# Patient Record
Sex: Female | Born: 1950 | Race: White | Hispanic: No | Marital: Married | Smoking: Never smoker
Health system: Southern US, Community
[De-identification: ages and names within clinical notes are randomized; demographics above are authoritative.]

## PROBLEM LIST (undated history)

## (undated) DIAGNOSIS — A5002 Early congenital syphilitic osteochondropathy: Secondary | ICD-10-CM

## (undated) DIAGNOSIS — E039 Hypothyroidism, unspecified: Secondary | ICD-10-CM

## (undated) DIAGNOSIS — B029 Zoster without complications: Secondary | ICD-10-CM

## (undated) DIAGNOSIS — M0520 Rheumatoid vasculitis with rheumatoid arthritis of unspecified site: Secondary | ICD-10-CM

## (undated) DIAGNOSIS — M9080 Osteopathy in diseases classified elsewhere, unspecified site: Secondary | ICD-10-CM

## (undated) HISTORY — PX: ABDOMINAL SURGERY: SHX537

---

## 1999-05-25 ENCOUNTER — Other Ambulatory Visit: Admission: RE | Admit: 1999-05-25 | Discharge: 1999-05-25 | Payer: Self-pay | Admitting: Obstetrics & Gynecology

## 2000-06-19 ENCOUNTER — Other Ambulatory Visit: Admission: RE | Admit: 2000-06-19 | Discharge: 2000-06-19 | Payer: Self-pay | Admitting: Obstetrics & Gynecology

## 2001-02-14 ENCOUNTER — Other Ambulatory Visit: Admission: RE | Admit: 2001-02-14 | Discharge: 2001-02-14 | Payer: Self-pay | Admitting: Obstetrics & Gynecology

## 2001-10-22 ENCOUNTER — Other Ambulatory Visit: Admission: RE | Admit: 2001-10-22 | Discharge: 2001-10-22 | Payer: Self-pay | Admitting: Obstetrics & Gynecology

## 2002-01-06 ENCOUNTER — Encounter: Admission: RE | Admit: 2002-01-06 | Discharge: 2002-01-06 | Payer: Self-pay | Admitting: Obstetrics & Gynecology

## 2002-01-06 ENCOUNTER — Encounter: Payer: Self-pay | Admitting: Obstetrics & Gynecology

## 2002-11-27 ENCOUNTER — Encounter: Admission: RE | Admit: 2002-11-27 | Discharge: 2002-11-27 | Payer: Self-pay | Admitting: Family Medicine

## 2002-12-08 ENCOUNTER — Other Ambulatory Visit: Admission: RE | Admit: 2002-12-08 | Discharge: 2002-12-08 | Payer: Self-pay | Admitting: Obstetrics & Gynecology

## 2003-01-08 ENCOUNTER — Encounter: Admission: RE | Admit: 2003-01-08 | Discharge: 2003-01-08 | Payer: Self-pay | Admitting: Obstetrics & Gynecology

## 2003-01-20 ENCOUNTER — Encounter: Admission: RE | Admit: 2003-01-20 | Discharge: 2003-01-20 | Payer: Self-pay | Admitting: Obstetrics & Gynecology

## 2003-02-15 ENCOUNTER — Other Ambulatory Visit: Admission: RE | Admit: 2003-02-15 | Discharge: 2003-02-15 | Payer: Self-pay | Admitting: Obstetrics & Gynecology

## 2003-06-17 ENCOUNTER — Ambulatory Visit (HOSPITAL_COMMUNITY): Admission: RE | Admit: 2003-06-17 | Discharge: 2003-06-17 | Payer: Self-pay | Admitting: Obstetrics & Gynecology

## 2003-07-05 ENCOUNTER — Other Ambulatory Visit: Admission: RE | Admit: 2003-07-05 | Discharge: 2003-07-05 | Payer: Self-pay | Admitting: Obstetrics & Gynecology

## 2003-12-17 ENCOUNTER — Ambulatory Visit: Payer: Self-pay | Admitting: Internal Medicine

## 2004-01-17 ENCOUNTER — Ambulatory Visit (HOSPITAL_COMMUNITY): Admission: RE | Admit: 2004-01-17 | Discharge: 2004-01-17 | Payer: Self-pay | Admitting: Obstetrics & Gynecology

## 2004-02-02 ENCOUNTER — Encounter: Admission: RE | Admit: 2004-02-02 | Discharge: 2004-02-02 | Payer: Self-pay | Admitting: Obstetrics & Gynecology

## 2004-07-17 ENCOUNTER — Ambulatory Visit: Payer: Self-pay | Admitting: Internal Medicine

## 2004-08-10 ENCOUNTER — Ambulatory Visit: Payer: Self-pay | Admitting: Pulmonary Disease

## 2004-08-21 ENCOUNTER — Ambulatory Visit: Payer: Self-pay | Admitting: Internal Medicine

## 2004-12-07 ENCOUNTER — Ambulatory Visit (HOSPITAL_COMMUNITY): Admission: RE | Admit: 2004-12-07 | Discharge: 2004-12-07 | Payer: Self-pay | Admitting: Obstetrics & Gynecology

## 2005-01-26 ENCOUNTER — Ambulatory Visit (HOSPITAL_COMMUNITY): Admission: RE | Admit: 2005-01-26 | Discharge: 2005-01-26 | Payer: Self-pay | Admitting: Gastroenterology

## 2010-02-18 ENCOUNTER — Encounter: Payer: Self-pay | Admitting: Obstetrics & Gynecology

## 2010-09-26 ENCOUNTER — Other Ambulatory Visit: Payer: Self-pay | Admitting: Gastroenterology

## 2010-09-26 ENCOUNTER — Ambulatory Visit (HOSPITAL_COMMUNITY)
Admission: RE | Admit: 2010-09-26 | Discharge: 2010-09-26 | Disposition: A | Discharge status: Home / Self Care | Payer: Medicare Other | Source: Ambulatory Visit | Attending: Gastroenterology | Admitting: Gastroenterology

## 2010-09-26 DIAGNOSIS — K644 Residual hemorrhoidal skin tags: Secondary | ICD-10-CM | POA: Insufficient documentation

## 2010-09-26 DIAGNOSIS — Z1211 Encounter for screening for malignant neoplasm of colon: Secondary | ICD-10-CM | POA: Insufficient documentation

## 2010-09-26 DIAGNOSIS — K648 Other hemorrhoids: Secondary | ICD-10-CM | POA: Insufficient documentation

## 2011-01-30 HISTORY — PX: OTHER SURGICAL HISTORY: SHX169

## 2014-11-01 ENCOUNTER — Other Ambulatory Visit: Payer: Self-pay | Admitting: Gastroenterology

## 2014-11-26 ENCOUNTER — Encounter (HOSPITAL_COMMUNITY): Admission: RE | Payer: Self-pay | Source: Ambulatory Visit

## 2014-11-26 ENCOUNTER — Ambulatory Visit (HOSPITAL_COMMUNITY): Admission: RE | Admit: 2014-11-26 | Payer: Medicare Other | Source: Ambulatory Visit | Admitting: Gastroenterology

## 2014-11-26 SURGERY — COLONOSCOPY
Anesthesia: Moderate Sedation

## 2014-12-10 ENCOUNTER — Encounter (HOSPITAL_COMMUNITY): Payer: Self-pay | Admitting: *Deleted

## 2014-12-10 ENCOUNTER — Ambulatory Visit (HOSPITAL_COMMUNITY)
Admission: RE | Admit: 2014-12-10 | Discharge: 2014-12-10 | Disposition: A | Discharge status: Home / Self Care | Payer: Medicare Other | Source: Ambulatory Visit | Attending: Gastroenterology | Admitting: Gastroenterology

## 2014-12-10 ENCOUNTER — Encounter (HOSPITAL_COMMUNITY)
Admission: RE | Disposition: A | Discharge status: Home / Self Care | Payer: Self-pay | Source: Ambulatory Visit | Attending: Gastroenterology

## 2014-12-10 DIAGNOSIS — Z86010 Personal history of colonic polyps: Secondary | ICD-10-CM | POA: Diagnosis not present

## 2014-12-10 DIAGNOSIS — D124 Benign neoplasm of descending colon: Secondary | ICD-10-CM | POA: Diagnosis not present

## 2014-12-10 DIAGNOSIS — Z1211 Encounter for screening for malignant neoplasm of colon: Secondary | ICD-10-CM | POA: Diagnosis not present

## 2014-12-10 DIAGNOSIS — K5730 Diverticulosis of large intestine without perforation or abscess without bleeding: Secondary | ICD-10-CM | POA: Diagnosis not present

## 2014-12-10 DIAGNOSIS — A5002 Early congenital syphilitic osteochondropathy: Secondary | ICD-10-CM | POA: Insufficient documentation

## 2014-12-10 DIAGNOSIS — Z942 Lung transplant status: Secondary | ICD-10-CM | POA: Insufficient documentation

## 2014-12-10 HISTORY — DX: Hypothyroidism, unspecified: E03.9

## 2014-12-10 HISTORY — DX: Rheumatoid vasculitis with rheumatoid arthritis of unspecified site: M05.20

## 2014-12-10 HISTORY — DX: Osteopathy in diseases classified elsewhere, unspecified site: M90.80

## 2014-12-10 HISTORY — DX: Early congenital syphilitic osteochondropathy: A50.02

## 2014-12-10 HISTORY — PX: COLONOSCOPY WITH PROPOFOL: SHX5780

## 2014-12-10 SURGERY — COLONOSCOPY WITH PROPOFOL
Anesthesia: Moderate Sedation

## 2014-12-10 MED ORDER — MIDAZOLAM HCL 10 MG/2ML IJ SOLN
INTRAMUSCULAR | Status: DC | PRN
  Administered 2014-12-10: 2 mg via INTRAVENOUS
  Administered 2014-12-10 (×2): 1 mg via INTRAVENOUS
  Administered 2014-12-10: 2 mg via INTRAVENOUS

## 2014-12-10 MED ORDER — DIPHENHYDRAMINE HCL 50 MG/ML IJ SOLN
INTRAMUSCULAR | Status: AC
  Filled 2014-12-10: qty 1

## 2014-12-10 MED ORDER — FENTANYL CITRATE (PF) 100 MCG/2ML IJ SOLN
INTRAMUSCULAR | Status: DC | PRN
  Administered 2014-12-10: 12.5 ug via INTRAVENOUS
  Administered 2014-12-10: 25 ug via INTRAVENOUS
  Administered 2014-12-10: 12.5 ug via INTRAVENOUS
  Administered 2014-12-10: 25 ug via INTRAVENOUS

## 2014-12-10 MED ORDER — SODIUM CHLORIDE 0.9 % IV SOLN: INTRAVENOUS | Status: DC

## 2014-12-10 MED ORDER — MIDAZOLAM HCL 5 MG/ML IJ SOLN
INTRAMUSCULAR | Status: AC
  Filled 2014-12-10: qty 2

## 2014-12-10 MED ORDER — FENTANYL CITRATE (PF) 100 MCG/2ML IJ SOLN
INTRAMUSCULAR | Status: AC
  Filled 2014-12-10: qty 2

## 2014-12-10 SURGICAL SUPPLY — 21 items

## 2014-12-10 NOTE — H&P (Signed)
  Brianna Norman HPI: The patient is here for follow up of her prior colonoscopy on 09/26/2010.  She had multiple tubular adenomas.  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  No complaints from the GI standpoint and she has not issues with breathing.  Past Medical History  Diagnosis Date  . Wegner's disease (congenital syphilitic osteochondritis)   . Rheumatoid arteritis   . Thyroid activity decreased     Past Surgical History  Procedure Laterality Date  . Lunmg transplant Bilateral 2013    History reviewed. No pertinent family history.  Social History:  reports that she has never smoked. She does not have any smokeless tobacco history on file. She reports that she does not drink alcohol or use illicit drugs.  Allergies:  Allergies  Allergen Reactions  . Nsaids     S/p lung transplant  . Quinolones     Ruptured achiles tendon    Medications:  Scheduled:  Continuous: . sodium chloride      No results found for this or any previous visit (from the past 24 hour(s)).   No results found.  ROS:  As stated above in the HPI otherwise negative.  Blood pressure 143/73, pulse 91, temperature 98.5 F (36.9 C), temperature source Oral, resp. rate 20, height 5\' 6"  (1.676 m), weight 52.617 kg (116 lb), SpO2 99 %.    PE: Gen: NAD, Alert and Oriented HEENT:  [place]/AT, EOMI Neck: Supple, no LAD Lungs: CTA Bilaterally CV: RRR without M/G/R ABM: Soft, NTND, +BS Ext: No C/C/E  Assessment/Plan: 1) Personal history of polyps - Colonoscopy.  Brianna Norman D 12/10/2014, 9:12 AM

## 2014-12-10 NOTE — Op Note (Signed)
[facility] [place], [address]   COLONOSCOPY PROCEDURE REPORT  PATIENT: Brianna Norman, Brianna Norman  MR#: [id number] BIRTHDATE: April 02, 1950 , 64  yrs. old GENDER: female ENDOSCOPIST: Carol Ada, MD REFERRED BY: PROCEDURE DATE:  2014/12/25 PROCEDURE:   Colonoscopy with snare polypectomy ASA CLASS:   Class III INDICATIONS: Personal history of polyps MEDICATIONS: Versed 6 mg IV and Fentanyl 75 mcg IV  DESCRIPTION OF PROCEDURE:   After the risks and benefits and of the procedure were explained, informed consent was obtained.  revealed no abnormalities of the rectum.    The Pentax Ped Colon [id number] endoscope was introduced through the anus and advanced to the cecum, which was identified by both the appendix and ileocecal valve .  The quality of the prep was good. .  The instrument was then slowly withdrawn as the colon was fully examined. Estimated blood loss is zero unless otherwise noted in this procedure report.    FINDINGS: The colonoscopy was difficult to perform as the patient has a long colon and there was significant looping.  A 3 mm sessile descending colon polyp was removed with a cold snare.  In the proximal colon scattered diverticula were found.  No other abnormalities noted.            The scope was then withdrawn from the patient and the procedure completed.  WITHDRAWAL TIME: 13 minutes  COMPLICATIONS: There were no immediate complications. ENDOSCOPIC IMPRESSION: 1) Colonic polyp. 2) Diverticula.  RECOMMENDATIONS: 1) Repeat colonsocopy in two years as result of her post transplant state. 2) Follow up biopsies.  REPEAT EXAM:  cc:  _______________________________ eSignedCarol Ada, MD December 25, 2014 9:58 AM   CPT CODES: ICD CODES:  The ICD and CPT codes recommended by this software are interpretations from the data that the clinical staff has captured with the software.  The verification of the translation of this report to  the ICD and CPT codes and modifiers is the sole responsibility of the health care institution and practicing physician where this report was generated.  [employer] will not be held responsible for the validity of the ICD and CPT codes included on this report.  AMA assumes no liability for data contained or not contained herein. CPT is a Designer, television/film set of the [employer].

## 2014-12-10 NOTE — Discharge Instructions (Signed)

## 2014-12-13 ENCOUNTER — Encounter (HOSPITAL_COMMUNITY): Payer: Self-pay | Admitting: Gastroenterology

## 2020-02-14 ENCOUNTER — Encounter (HOSPITAL_BASED_OUTPATIENT_CLINIC_OR_DEPARTMENT_OTHER): Payer: Self-pay

## 2020-02-14 ENCOUNTER — Emergency Department (HOSPITAL_BASED_OUTPATIENT_CLINIC_OR_DEPARTMENT_OTHER): Payer: Medicare Other

## 2020-02-14 ENCOUNTER — Emergency Department (HOSPITAL_BASED_OUTPATIENT_CLINIC_OR_DEPARTMENT_OTHER)
Admission: EM | Admit: 2020-02-14 | Discharge: 2020-02-14 | Disposition: A | Discharge status: Home / Self Care | Payer: Medicare Other | Attending: Emergency Medicine | Admitting: Emergency Medicine

## 2020-02-14 ENCOUNTER — Other Ambulatory Visit: Payer: Self-pay

## 2020-02-14 DIAGNOSIS — Z20822 Contact with and (suspected) exposure to covid-19: Secondary | ICD-10-CM | POA: Diagnosis not present

## 2020-02-14 DIAGNOSIS — Z942 Lung transplant status: Secondary | ICD-10-CM | POA: Diagnosis not present

## 2020-02-14 DIAGNOSIS — R0902 Hypoxemia: Secondary | ICD-10-CM

## 2020-02-14 HISTORY — DX: Zoster without complications: B02.9

## 2020-02-14 LAB — RESP PANEL BY RT-PCR (FLU A&B, COVID) ARPGX2
Influenza A by PCR: NEGATIVE
Influenza B by PCR: NEGATIVE
SARS Coronavirus 2 by RT PCR: NEGATIVE

## 2020-02-14 NOTE — ED Notes (Signed)
Patient ambulated in room 2 on r/a, HR 90-95, SpO2 95-100%, patient denies increased WOB.

## 2020-02-14 NOTE — ED Triage Notes (Signed)
Pt presents with 82 SpO2 recorded at home and denies ShOB. Pt has associated nasal congestion, fatigue. Pt had positive COVID contact a week prior. Pt has had flu and 3 COVID vaccinations. Pt with hx of bilateral lung transplant.

## 2020-02-14 NOTE — ED Provider Notes (Signed)
[facility] EMERGENCY DEPARTMENT Provider Note   CSN: [id number] Arrival date & time: 02/14/20  1527     History Chief Complaint  Patient presents with  . Low oxygen sat at home     Brianna Norman is a 70 y.o. female.  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 Patient states her O2 sats have been in the mid 80s for the past few days, 82% today and called her transplant team at [facility] who recommended she get seen. Patient has had 3 COVID vaccines. Denies SHOB, fevers or any other complaints or concerns.         Past Medical History:  Diagnosis Date  . Rheumatoid arteritis ([place])   . Shingles   . Thyroid activity decreased   . Wegner's disease (congenital syphilitic osteochondritis)     There are no problems to display for this patient.   Past Surgical History:  Procedure Laterality Date  . ABDOMINAL SURGERY    . COLONOSCOPY WITH PROPOFOL N/A 12/10/2014   Procedure: COLONOSCOPY WITH PROPOFOL;  Surgeon: Carol Ada, MD;  Location: [facility] ENDOSCOPY;  Service: Endoscopy;  Laterality: N/A;  . lunmg transplant Bilateral 2013     OB History   No obstetric history on file.     No family history on file.  Social History   Tobacco Use  . Smoking status: Never Smoker  . Smokeless tobacco: Never Used  Vaping Use  . Vaping Use: Never used  Substance Use Topics  . Alcohol use: No  . Drug use: No    Home Medications Prior to Admission medications   Medication Sig Start Date End Date Taking? Authorizing Provider  acetaminophen (TYLENOL) 500 MG tablet Take 500 mg by mouth every 6 (six) hours as needed for mild pain.    [provider]  calcium carbonate 1250 MG capsule Take 1,250 mg by mouth 2  (two) times daily with a meal.    [provider]  cholecalciferol (VITAMIN D) 400 UNITS TABS tablet Take 1,000 Units by mouth daily.    [provider]  escitalopram (LEXAPRO) 20 MG tablet Take 20 mg by mouth daily.    [provider]  ezetimibe (ZETIA) 10 MG tablet Take 10 mg by mouth daily.    [provider]  fluconazole (DIFLUCAN) 100 MG tablet Take 100 mg by mouth daily.    [provider]  levothyroxine (SYNTHROID, LEVOTHROID) 100 MCG tablet Take 100 mcg by mouth daily before breakfast.    [provider]  midodrine (PROAMATINE) 5 MG tablet Take 5 mg by mouth 3 (three) times daily with meals.    [provider]  Multiple Vitamin (MULTIVITAMIN) tablet Take 1 tablet by mouth daily.    [provider]  mycophenolate (CELLCEPT) 500 MG tablet Take by mouth 2 (two) times daily.    [provider]  pantoprazole (PROTONIX) 40 MG tablet Take 40 mg by mouth daily.    [provider]  predniSONE (DELTASONE) 5 MG tablet Take 5 mg by mouth daily with breakfast.    [provider]  sulfamethoxazole-trimethoprim (BACTRIM,SEPTRA) 400-80 MG tablet Take 1 tablet by mouth 3 (three) times a week.    [provider]  tacrolimus (PROGRAF) 0.5 MG capsule Take 0.5 mg  by mouth 2 (two) times daily.    [provider]  tacrolimus (PROGRAF) 1 MG capsule Take 1 mg by mouth 2 (two) times daily.    [provider]    Allergies    Nsaids and Quinolones  Review of Systems   Review of Systems  Constitutional: Positive for fatigue. Negative for chills and fever.  HENT: Positive for congestion.   Respiratory: Negative for shortness of breath.   Cardiovascular: Negative for chest pain.  Skin: Negative for wound.  Allergic/Immunologic: Positive for immunocompromised state.  Neurological: Negative for weakness.  All other systems reviewed and are negative.   Physical Exam Updated Vital  Signs BP 132/76 (BP Location: Right Arm)   Pulse 82   Temp 97.7 F (36.5 C) (Oral)   Resp 18   Ht 5\' 6"  (1.676 m)   Wt 43.1 kg   SpO2 100%   BMI 15.33 kg/m   Physical Exam Vitals and nursing note reviewed.  Constitutional:      General: She is not in acute distress.    Appearance: She is well-developed and well-nourished. She is not diaphoretic.  HENT:     Head: Normocephalic and atraumatic.  Cardiovascular:     Rate and Rhythm: Normal rate and regular rhythm.     Pulses: Normal pulses.     Heart sounds: Normal heart sounds.  Pulmonary:     Effort: Pulmonary effort is normal.     Breath sounds: Normal breath sounds.  Musculoskeletal:     Right lower leg: No edema.     Left lower leg: No edema.  Skin:    General: Skin is warm and dry.     Findings: No erythema or rash.  Neurological:     Mental Status: She is alert and oriented to person, place, and time.  Psychiatric:        Mood and Affect: Mood and affect normal.        Behavior: Behavior normal.     ED Results / Procedures / Treatments   Labs (all labs ordered are listed, but only abnormal results are displayed) Labs Reviewed  RESP PANEL BY RT-PCR (FLU A&B, COVID) [id number]    EKG None  Radiology DG Chest Port 1 View  Result Date: 02/14/2020 CLINICAL DATA:  Decreased oxygen saturation with mild nasal congestion. EXAM: PORTABLE CHEST 1 VIEW COMPARISON:  August 06, 2017 FINDINGS: Sternal wires are present with multiple mediastinal surgical clips noted. Mild biapical scarring and/or atelectasis is seen. There is no evidence of acute infiltrate, pleural effusion or pneumothorax. The heart size and mediastinal contours are within normal limits. The visualized skeletal structures are unremarkable. IMPRESSION: 1. Evidence of prior median and lung transplant. 2. No acute or active cardiopulmonary disease. Electronically Signed   By: Virgina Norfolk M.D.   On: 02/14/2020 16:45    Procedures Procedures (including  critical care time)  Medications Ordered in ED Medications - No data to display  ED Course  I have reviewed the triage vital signs and the nursing notes.  Pertinent labs & imaging results that were available during my care of the patient were reviewed by me and considered in my medical decision making (see chart for details).  Clinical Course as of 02/14/20 5681  Nancy Fetter [birth year], 4761  421 70 year old female with concern for low O2 reading at home with above history. On arrival in the ER, O2 sat 97-100%, ambulatory O2 sat also WNL. COVID test is negative. Patient reassured with negative test,  CXR without acute findings, O2 sats normal. Patient will follow up with her transplant team at [facility].  [LM]    Clinical Course User Index [LM] Roque Lias   MDM Rules/Calculators/A&P                          Final Clinical Impression(s) / ED Diagnoses Final diagnoses:  Oxygen desaturation    Rx / DC Orders ED Discharge Orders    None       Tacy Learn, PA-C 02/14/20 Sonia Baller, MD 02/14/20 2035

## 2020-02-14 NOTE — Discharge Instructions (Addendum)
Your oxygen saturation while in the ER has been normal. Normal and reassuring ambulatory oxygen saturation. Chest x-ray is unremarkable, no acute abnormal findings. Your COVID test is negative today.  Recommend follow up phone call with your team at [facility], return to the ER as needed.

## 2020-11-01 ENCOUNTER — Other Ambulatory Visit: Payer: Self-pay | Admitting: Gastroenterology

## 2020-12-14 ENCOUNTER — Other Ambulatory Visit: Payer: Self-pay

## 2020-12-30 ENCOUNTER — Encounter (HOSPITAL_COMMUNITY)
Admission: RE | Disposition: A | Discharge status: Home / Self Care | Payer: Self-pay | Source: Ambulatory Visit | Attending: Gastroenterology

## 2020-12-30 ENCOUNTER — Ambulatory Visit (HOSPITAL_COMMUNITY): Payer: Medicare Other | Admitting: Certified Registered"

## 2020-12-30 ENCOUNTER — Ambulatory Visit (HOSPITAL_COMMUNITY)
Admission: RE | Admit: 2020-12-30 | Discharge: 2020-12-30 | Disposition: A | Discharge status: Home / Self Care | Payer: Medicare Other | Source: Ambulatory Visit | Attending: Gastroenterology | Admitting: Gastroenterology

## 2020-12-30 ENCOUNTER — Other Ambulatory Visit: Payer: Self-pay

## 2020-12-30 ENCOUNTER — Encounter (HOSPITAL_COMMUNITY): Payer: Self-pay | Admitting: Gastroenterology

## 2020-12-30 DIAGNOSIS — E039 Hypothyroidism, unspecified: Secondary | ICD-10-CM | POA: Insufficient documentation

## 2020-12-30 DIAGNOSIS — Z1211 Encounter for screening for malignant neoplasm of colon: Secondary | ICD-10-CM | POA: Insufficient documentation

## 2020-12-30 DIAGNOSIS — K635 Polyp of colon: Secondary | ICD-10-CM | POA: Diagnosis not present

## 2020-12-30 HISTORY — PX: POLYPECTOMY: SHX5525

## 2020-12-30 HISTORY — PX: COLONOSCOPY WITH PROPOFOL: SHX5780

## 2020-12-30 SURGERY — COLONOSCOPY WITH PROPOFOL
Anesthesia: Monitor Anesthesia Care

## 2020-12-30 MED ORDER — PROPOFOL 500 MG/50ML IV EMUL
INTRAVENOUS | Status: DC | PRN
  Administered 2020-12-30: 75 ug/kg/min via INTRAVENOUS

## 2020-12-30 MED ORDER — FLEET ENEMA 7-19 GM/118ML RE ENEM
1.0000 | ENEMA | Freq: Once | RECTAL | Status: AC
  Administered 2020-12-30: 1 via RECTAL

## 2020-12-30 MED ORDER — PROPOFOL 10 MG/ML IV BOLUS
INTRAVENOUS | Status: DC | PRN
  Administered 2020-12-30: 10 mg via INTRAVENOUS
  Administered 2020-12-30 (×2): 20 mg via INTRAVENOUS

## 2020-12-30 MED ORDER — FLEET ENEMA 7-19 GM/118ML RE ENEM
ENEMA | RECTAL | Status: AC
  Filled 2020-12-30: qty 1

## 2020-12-30 MED ORDER — SODIUM CHLORIDE 0.9 % IV SOLN: INTRAVENOUS | Status: DC

## 2020-12-30 MED ORDER — LIDOCAINE 2% (20 MG/ML) 5 ML SYRINGE
INTRAMUSCULAR | Status: DC | PRN
  Administered 2020-12-30: 40 mg via INTRAVENOUS

## 2020-12-30 MED ORDER — LACTATED RINGERS IV SOLN: INTRAVENOUS | Status: DC

## 2020-12-30 SURGICAL SUPPLY — 22 items

## 2020-12-30 NOTE — H&P (Signed)
Brianna Norman HPI: At this time the patient denies any problems with nausea, vomiting, fevers, chills, abdominal pain, diarrhea, constipation, hematochezia, melena, or dysphagia. The patient denies any known family history of colon cancers. No complaints of chest pain, SOB, MI, or sleep apnea.  Her last colonoscopy was on 12/09/2016 and it was normal.  Over the past year she started to develop problems with chronic lung rejection.  She is closely monitored by [facility].  In 2019 she suffered a perforation in the right colon secondary to constipation and use of some narcotics.  She required a resection and she had an ostomy for 6 months with a reversal.  Her gynecologist found some polyps with year colposcopies once a year over the past three years.  She was recommended from gynecology and pulmonary to have a repeat colonoscopy as she "starting to grow things".   Past Medical History:  Diagnosis Date   Rheumatoid arteritis ([place])    Shingles    Thyroid activity decreased    Wegner's disease (congenital syphilitic osteochondritis)     Past Surgical History:  Procedure Laterality Date   ABDOMINAL SURGERY     COLONOSCOPY WITH PROPOFOL N/A 12/10/2014   Procedure: COLONOSCOPY WITH PROPOFOL;  Surgeon: Carol Ada, MD;  Location: [facility] ENDOS[facility];  Service: Endoscopy;  Laterality: N/A;   lunmg transplant Bilateral 2013    History reviewed. No pertinent family history.  Social History:  reports that she has never smoked. She has never used smokeless tobacco. She reports that she does not drink alcohol and does not use drugs.  Allergies:  Allergies  Allergen Reactions   Nsaids     S/p lung transplant   Quinolones     Ruptured achiles tendon    Medications: Scheduled: Continuous:  sodium chloride     lactated ringers      No results found for this or any previous visit (from the past 24 hour(s)).   No results found.  ROS:  As stated above in the HPI otherwise negative.  Height 5\' 6"  (1.676  m), weight 45.4 kg.    PE: Gen: NAD, Alert and Oriented HEENT:  [place]/AT, EOMI Neck: Supple, no LAD Lungs: CTA Bilaterally CV: RRR without M/G/R ABD: Soft, NTND, +BS Ext: No C/C/E  Assessment/Plan: 1) Screening colonoscopy.  Stamatia Masri D 12/30/2020, 9:42 AM

## 2020-12-30 NOTE — Anesthesia Preprocedure Evaluation (Addendum)
Anesthesia Evaluation  Patient identified by MRN, date of birth, ID band Patient awake    Reviewed: Allergy & Precautions, NPO status , Patient's Chart, lab work & pertinent test results  Airway Mallampati: I       Dental  (+) Chipped, Caps,    Pulmonary    Pulmonary exam normal        Cardiovascular negative cardio ROS Normal cardiovascular exam     Neuro/Psych negative neurological ROS  negative psych ROS   GI/Hepatic Neg liver ROS,   Endo/Other  Hypothyroidism   Renal/GU negative Renal ROS     Musculoskeletal  (+) Arthritis , Rheumatoid disorders,    Abdominal Normal abdominal exam  (+)   Peds  Hematology   Anesthesia Other Findings   Reproductive/Obstetrics                            Anesthesia Physical Anesthesia Plan  ASA: 2  Anesthesia Plan: MAC   Post-op Pain Management:    Induction: Intravenous  PONV Risk Score and Plan: Propofol infusion and Treatment may vary due to age or medical condition  Airway Management Planned: Natural Airway and Simple Face Mask  Additional Equipment: None  Intra-op Plan:   Post-operative Plan:   Informed Consent: I have reviewed the patients History and Physical, chart, labs and discussed the procedure including the risks, benefits and alternatives for the proposed anesthesia with the patient or authorized representative who has indicated his/her understanding and acceptance.       Plan Discussed with: CRNA  Anesthesia Plan Comments:         Anesthesia Quick Evaluation

## 2020-12-30 NOTE — Discharge Instructions (Signed)

## 2020-12-30 NOTE — Progress Notes (Signed)
Pt stated she didn't feel like she was completely cleaned out.  Pt went to bathroom and her stool was dark brown, liquid, cloudy.  Discussed with Dr Benson Norway and it was decided that pt should have an enema.

## 2020-12-30 NOTE — Op Note (Signed)
[facility] Patient Name: Brianna Norman Procedure Date: 12/30/2020 MRN: [id number] Attending MD: Carol Ada , MD Date of Birth: 05-16-1950 CSN: [id number] Age: 70 Admit Type: Outpatient Procedure:                Colonoscopy Indications:              Screening for colorectal malignant neoplasm Providers:                Carol Ada, MD, Grace Isaac, RN, [facility]                            Technician, Technician Referring MD:              Medicines:                Propofol per Anesthesia Complications:            No immediate complications. Estimated Blood Loss:     Estimated blood loss: none. Procedure:                Pre-Anesthesia Assessment:                           - Prior to the procedure, a History and Physical                            was performed, and patient medications and                            allergies were reviewed. The patient's tolerance of                            previous anesthesia was also reviewed. The risks                            and benefits of the procedure and the sedation                            options and risks were discussed with the patient.                            All questions were answered, and informed consent                            was obtained. Prior Anticoagulants: The patient has                            taken no previous anticoagulant or antiplatelet                            agents. ASA Grade Assessment: III - A patient with                            severe systemic disease. After reviewing the risks  and benefits, the patient was deemed in                            satisfactory condition to undergo the procedure.                           - Sedation was administered by an anesthesia                            professional. Deep sedation was attained.                           After obtaining informed consent, the colonoscope                            was passed under  direct vision. Throughout the                            procedure, the patient's blood pressure, pulse, and                            oxygen saturations were monitored continuously. The                            [id number] ([id number] Olympus colonoscope was                            introduced through the anus and advanced to the the                            cecum, identified by appendiceal orifice and                            ileocecal valve. The colonoscopy was technically                            difficult and complex due to poor bowel prep.                            Successful completion of the procedure was aided by                            lavage. The patient tolerated the procedure well.                            The quality of the bowel preparation was evaluated                            using the BBPS [facility] Bowel Preparation Scale)                            with scores of: Right Colon = 2 (minor amount of  residual staining, small fragments of stool and/or                            opaque liquid, but mucosa seen well), Transverse                            Colon = 2 (minor amount of residual staining, small                            fragments of stool and/or opaque liquid, but mucosa                            seen well) and Left Colon = 2 (minor amount of                            residual staining, small fragments of stool and/or                            opaque liquid, but mucosa seen well). The total                            BBPS score equals 6. The quality of the bowel                            preparation was good. The ileocecal valve,                            appendiceal orifice, and rectum were photographed. Scope In: 10:32:35 AM Scope Out: 10:55:14 AM Scope Withdrawal Time: 0 hours 15 minutes 41 seconds  Total Procedure Duration: 0 hours 22 minutes 39 seconds  Findings:      A 3 mm polyp was found in the transverse  colon. The polyp was sessile.       The polyp was removed with a cold snare. Resection and retrieval were       complete.      Extensive lavage was used to obtain good to excellent views of the       mucosa. Impression:               - One 3 mm polyp in the transverse colon, removed                            with a cold snare. Resected and retrieved. Moderate Sedation:      Not Applicable - Patient had care per Anesthesia. Recommendation:           - Patient has a contact number available for                            emergencies. The signs and symptoms of potential                            delayed complications were discussed with the  patient. Return to normal activities tomorrow.                            Written discharge instructions were provided to the                            patient.                           - Resume previous diet.                           - Continue present medications.                           - Await pathology results.                           - Repeat colonoscopy in 5 years for surveillance or                            sooner if desired by transplant. Procedure Code(s):        --- Professional ---                           [phone], Colonoscopy, flexible; with removal of                            tumor(s), polyp(s), or other lesion(s) by snare                            technique Diagnosis Code(s):        --- Professional ---                           Z12.11, Encounter for screening for malignant                            neoplasm of colon                           K63.5, Polyp of colon CPT copyright 2019 American Medical Association. All rights reserved. The codes documented in this report are preliminary and upon coder review may  be revised to meet current compliance requirements. Carol Ada, MD Carol Ada, MD 12/30/2020 11:00:49 AM This report has been signed electronically. Number of Addenda: 0

## 2020-12-30 NOTE — Transfer of Care (Signed)
Immediate Anesthesia Transfer of Care Note  Patient: Brianna Norman  Procedure(s) Performed: COLONOSCOPY WITH PROPOFOL POLYPECTOMY  Patient Location: Endoscopy Unit  Anesthesia Type:MAC  Level of Consciousness: drowsy and patient cooperative  Airway & Oxygen Therapy: Patient Spontanous Breathing and Patient connected to face mask oxygen  Post-op Assessment: Report given to RN and Post -op Vital signs reviewed and stable  Post vital signs: Reviewed and stable  Last Vitals:  Vitals Value Taken Time  BP    Temp    Pulse    Resp    SpO2      Last Pain:  Vitals:   12/30/20 1013  TempSrc: Oral  PainSc: 0-No pain         Complications: No notable events documented.

## 2020-12-30 NOTE — Anesthesia Procedure Notes (Signed)
Procedure Name: MAC Date/Time: 12/30/2020 10:22 AM Performed by: Cynda Familia, CRNA Pre-anesthesia Checklist: Patient identified, Emergency Drugs available, Suction available, Patient being monitored and Timeout performed Patient Re-evaluated:Patient Re-evaluated prior to induction Oxygen Delivery Method: Circle system utilized Preoxygenation: Pre-oxygenation with 100% oxygen Laryngoscope Size: Miller and 2 Placement Confirmation: positive ETCO2 and breath sounds checked- equal and bilateral Dental Injury: Teeth and Oropharynx as per pre-operative assessment

## 2020-12-30 NOTE — Anesthesia Postprocedure Evaluation (Signed)
Anesthesia Post Note  Patient: Brianna Norman  Procedure(s) Performed: COLONOSCOPY WITH PROPOFOL POLYPECTOMY     Patient location during evaluation: Endoscopy Anesthesia Type: MAC Level of consciousness: awake Pain management: pain level controlled Vital Signs Assessment: post-procedure vital signs reviewed and stable Respiratory status: spontaneous breathing Cardiovascular status: stable Postop Assessment: no apparent nausea or vomiting Anesthetic complications: no   No notable events documented.  Last Vitals:  Vitals:   12/30/20 1110 12/30/20 1120  BP: 135/68 126/65  Pulse: 81 84  Resp: (!) 28 20  Temp: 36.5 C   SpO2: 100% 98%    Last Pain:  Vitals:   12/30/20 1110  TempSrc: Oral  PainSc: 0-No pain   Pain Goal:                   Huston Foley

## 2021-01-02 ENCOUNTER — Encounter (HOSPITAL_COMMUNITY): Payer: Self-pay | Admitting: Gastroenterology

## 2021-01-02 LAB — SURGICAL PATHOLOGY

## 2021-03-28 ENCOUNTER — Encounter (HOSPITAL_BASED_OUTPATIENT_CLINIC_OR_DEPARTMENT_OTHER): Payer: Self-pay

## 2021-03-28 ENCOUNTER — Emergency Department (HOSPITAL_BASED_OUTPATIENT_CLINIC_OR_DEPARTMENT_OTHER)
Admission: EM | Admit: 2021-03-28 | Discharge: 2021-03-28 | Disposition: A | Discharge status: Home / Self Care | Payer: Medicare Other | Attending: Emergency Medicine | Admitting: Emergency Medicine

## 2021-03-28 ENCOUNTER — Other Ambulatory Visit: Payer: Self-pay

## 2021-03-28 DIAGNOSIS — K6289 Other specified diseases of anus and rectum: Secondary | ICD-10-CM | POA: Diagnosis not present

## 2021-03-28 DIAGNOSIS — R053 Chronic cough: Secondary | ICD-10-CM | POA: Insufficient documentation

## 2021-03-28 DIAGNOSIS — R0981 Nasal congestion: Secondary | ICD-10-CM | POA: Diagnosis not present

## 2021-03-28 DIAGNOSIS — F430 Acute stress reaction: Secondary | ICD-10-CM | POA: Diagnosis not present

## 2021-03-28 DIAGNOSIS — M62838 Other muscle spasm: Secondary | ICD-10-CM | POA: Diagnosis not present

## 2021-03-28 MED ORDER — DIAZEPAM 2.5 MG RE GEL: 2.5000 mg | Freq: Once | RECTAL | 0 refills | Status: DC

## 2021-03-28 MED ORDER — BACLOFEN 10 MG PO TABS: 10.0000 mg | ORAL_TABLET | Freq: Every evening | ORAL | 0 refills | Status: AC | PRN

## 2021-03-28 MED ORDER — DILTIAZEM GEL 2 %: 1.0000 "application " | Freq: Two times a day (BID) | CUTANEOUS | 0 refills | Status: DC

## 2021-03-28 NOTE — ED Notes (Signed)
Pt in bed, pt c/o muscles spasms by her anus, states that she has a hx of this and it was better with pt and exercise, ice pack offered and accepted. Pt awaits md eval

## 2021-03-28 NOTE — Discharge Instructions (Addendum)
You are having spasms of your levator ani. You can try diltiazem gel 2%, apply twice daily to assess for improvement of muscle spasms. Also I recommend following up with your GI doc to inquire about botox injections. I also recommend follow up with your physical therapist as you discussed and starting your exercises again. You may also try another muscle relaxer and assess for improvement, I gave you a prescription for baclofen which you can also try.

## 2021-03-28 NOTE — ED Triage Notes (Signed)
Pt c/o rectal pain/muscle spasm x 40 mins-pt reports hx of same that she has been seen for PT-NAD-steady gait

## 2021-03-28 NOTE — ED Provider Notes (Signed)
[place] EMERGENCY DEPARTMENT Provider Note   CSN: [id number] Arrival date & time: 03/28/21  1628     History  Chief Complaint  Patient presents with   Rectal Pain    Brianna Norman is a 71 y.o. female.  80 woman with significant PMH of double lung transplant presents today with worsening levator ani spasms. She has a history of levator ani spasms going back to about a year ago. She went to a GI clinic and was diagnosed with this and they recommended physical therapy. Patient completed 3-4 months of physical therapy and had good results with improvement in symptoms. Over time she stopped doing the exercises and this wasn't a problem until the last month where she has had increased stress. Patient has chronic cough related to her lung transplant and is being followed for this at [facility]. 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 On the drive back from [place], she had another spasm, so they stopped here for evaluation. She reports the spasms are excruciating anal pain that used to last only a few minutes, but are now lasting as long as 20 minutes. These episodes can happen any time, but often at night and awaken her from sleep and prevent her from going back to sleep. They do not occur with defecation. She does not have anorectal pain between episodes. She has had no fever or chills, she does have runny nose (seasonal allergies), and chronic cough from lung history (see above), no n/v/d, rash. She has tried a muscle relaxant for headaches, tizanadine, which has helped her headaches, but she still wakes up with spasm pain.       Home Medications Prior to Admission medications   Medication Sig Start Date End Date Taking? Authorizing Provider  acetaminophen (TYLENOL) 500 MG tablet Take 500 mg by mouth every 6 (six) hours as needed for mild pain.    [provider]  azithromycin (ZITHROMAX) 250 MG  tablet Take 250 mg by mouth every Monday, Wednesday, and Friday.    [provider]  Calcium Citrate-Vitamin D 250-5 MG-MCG TABS Take 1 tablet by mouth daily.    [provider]  carboxymethylcellulose (REFRESH PLUS) 0.5 % SOLN 1 drop daily.    [provider]  cetirizine (ZYRTEC) 10 MG tablet Take 10 mg by mouth daily as needed for allergies.    [provider]  Cholecalciferol 25 MCG (1000 UT) tablet Take 1,000 Units by mouth daily.    [provider]  escitalopram (LEXAPRO) 20 MG tablet Take 20 mg by mouth daily.    [provider]  ezetimibe (ZETIA) 10 MG tablet Take 10 mg by mouth daily.    [provider]  ferrous sulfate 325 (65 FE) MG tablet Take 325 mg by mouth daily with breakfast.    [provider]  levothyroxine (SYNTHROID) 125 MCG tablet Take 125 mcg by mouth daily before breakfast.    [provider]  Multiple Vitamin (MULTIVITAMIN) tablet Take 1 tablet by mouth daily.    [provider]  mycophenolate (CELLCEPT) 500 MG tablet Take 500 mg by mouth 2 (two) times daily.    [provider]  Omega-3 Fatty Acids (FISH OIL PO) Take 1 capsule by mouth daily.    [provider]  pantoprazole (PROTONIX) 40 MG tablet Take 40 mg by mouth daily.    [provider]  predniSONE (DELTASONE) 5 MG tablet Take  5 mg by mouth daily with breakfast.    [provider]  sodium chloride (OCEAN) 0.65 % SOLN nasal spray Place 1 spray into both nostrils in the morning and at bedtime.    [provider]  tacrolimus (PROGRAF) 0.5 MG capsule Take 0.5 mg by mouth 2 (two) times daily.    [provider]  tacrolimus (PROGRAF) 1 MG capsule Take 1 mg by mouth 2 (two) times daily.    [provider]  vitamin B-12 (CYANOCOBALAMIN) 1000 MCG tablet Take 1,000 mcg by mouth daily.    [provider]      Allergies    Nsaids and Quinolones    Review of Systems    Review of Systems  Constitutional:  Negative for activity change, appetite change, chills and fever.  HENT:  Positive for rhinorrhea. Negative for sinus pressure and sore throat.   Respiratory:  Positive for cough.   Cardiovascular:  Negative for chest pain.  Gastrointestinal:  Positive for rectal pain. Negative for abdominal pain, anal bleeding, constipation, diarrhea, nausea and vomiting.  All other systems reviewed and are negative.  Physical Exam Updated Vital Signs BP (!) 157/86 (BP Location: Left Arm)    Pulse 73    Temp 98 F (36.7 C) (Oral)    Resp 18    Ht 5\' 6"  (1.676 m)    Wt 46.7 kg    SpO2 98%    BMI 16.62 kg/m  Physical Exam Vitals and nursing note reviewed. Exam conducted with a chaperone present.  Constitutional:      General: She is not in acute distress.    Appearance: Normal appearance. She is not ill-appearing, toxic-appearing or diaphoretic.     Comments: Thin-appearing  HENT:     Head: Normocephalic and atraumatic.     Nose: No congestion.     Mouth/Throat:     Mouth: Mucous membranes are moist.     Pharynx: Oropharynx is clear.  Eyes:     Conjunctiva/sclera: Conjunctivae normal.  Abdominal:     General: Abdomen is flat. Bowel sounds are normal.     Palpations: Abdomen is soft.  Genitourinary:    Rectum: Normal.     Comments: Skin around rectum is normal, intact, no erythema, edema, one small, not thrombosed external hemorrhoid present. Musculoskeletal:     Right lower leg: No edema.     Left lower leg: No edema.  Skin:    General: Skin is warm and dry.  Neurological:     General: No focal deficit present.     Mental Status: She is alert. Mental status is at baseline.  Psychiatric:        Mood and Affect: Mood normal.        Behavior: Behavior normal.    ED Results / Procedures / Treatments   Labs (all labs ordered are listed, but only abnormal results are displayed) Labs Reviewed - No data to display  EKG None  Radiology No results  found.  Procedures Procedures    Medications Ordered in ED Medications - No data to display  ED Course/ Medical Decision Making/ A&P                           Medical Decision Making 71 yo woman presents with levator ani spasms that are worsening. She meets criteria of proctalgia fugax, with pain lasting 20 mins, no pain between spasms, not related to defecation. Physical exam normal. Likely worse in setting  of stress, patient has appropriate follow up with GI, pelvic floor PT, PCP, and pulmonology for chronic problems. Recommend trying various options for relief including diltiazem gel 2%, low risk of systemic absorption and may help, also can try another muscle relaxant, or diastat rectal suppositories. Recommend she follow up with GI, may benefit from botox. She has upcoming appt with PT.   Amount and/or Complexity of Data Reviewed Independent Historian: spouse External Data Reviewed: notes.  Risk Prescription drug management.          Final Clinical Impression(s) / ED Diagnoses Final diagnoses:  None    Rx / DC Orders ED Discharge Orders     None         Gladys Damme, MD 03/28/21 1587    Gareth Morgan, MD 03/29/21 [phone]

## 2021-03-29 ENCOUNTER — Telehealth (HOSPITAL_BASED_OUTPATIENT_CLINIC_OR_DEPARTMENT_OTHER): Payer: Self-pay | Admitting: Emergency Medicine

## 2021-03-29 MED ORDER — DIAZEPAM 2.5 MG RE GEL: 2.5000 mg | Freq: Every day | RECTAL | 0 refills | Status: DC | PRN

## 2021-03-29 MED ORDER — DILTIAZEM GEL 2 %
1.0000 | Freq: Two times a day (BID) | CUTANEOUS | 0 refills | Status: DC
Start: 2021-03-29 — End: 2022-01-04

## 2021-03-29 NOTE — Telephone Encounter (Signed)
[facility] called to advise they cannot fill the Diastat for prescription by a resident and required MD to represcribe.  They also advised that he did not have Cardizem gel. ? ?Medications were redirected to [facility] as they are compounding pharmacy and may be able to provide the Cardizem gel prescribed during the visit.  I have also reordered the Diastat gel to this location. ?

## 2022-01-02 ENCOUNTER — Encounter (HOSPITAL_COMMUNITY): Payer: Self-pay

## 2022-01-02 ENCOUNTER — Other Ambulatory Visit: Payer: Self-pay

## 2022-01-02 ENCOUNTER — Emergency Department (HOSPITAL_BASED_OUTPATIENT_CLINIC_OR_DEPARTMENT_OTHER): Payer: Medicare Other

## 2022-01-02 ENCOUNTER — Inpatient Hospital Stay (HOSPITAL_BASED_OUTPATIENT_CLINIC_OR_DEPARTMENT_OTHER)
Admission: EM | Admit: 2022-01-02 | Discharge: 2022-01-04 | DRG: 205 | Disposition: A | Discharge status: Other Acute Inpatient Hospital | Payer: Medicare Other | Attending: Internal Medicine | Admitting: Internal Medicine

## 2022-01-02 DIAGNOSIS — Z681 Body mass index (BMI) 19 or less, adult: Secondary | ICD-10-CM | POA: Diagnosis not present

## 2022-01-02 DIAGNOSIS — G4733 Obstructive sleep apnea (adult) (pediatric): Secondary | ICD-10-CM | POA: Diagnosis present

## 2022-01-02 DIAGNOSIS — Z20822 Contact with and (suspected) exposure to covid-19: Secondary | ICD-10-CM | POA: Diagnosis not present

## 2022-01-02 DIAGNOSIS — I129 Hypertensive chronic kidney disease with stage 1 through stage 4 chronic kidney disease, or unspecified chronic kidney disease: Secondary | ICD-10-CM | POA: Diagnosis not present

## 2022-01-02 DIAGNOSIS — B9789 Other viral agents as the cause of diseases classified elsewhere: Secondary | ICD-10-CM | POA: Diagnosis not present

## 2022-01-02 DIAGNOSIS — T86810 Lung transplant rejection: Secondary | ICD-10-CM | POA: Diagnosis not present

## 2022-01-02 DIAGNOSIS — J189 Pneumonia, unspecified organism: Secondary | ICD-10-CM | POA: Diagnosis present

## 2022-01-02 DIAGNOSIS — N184 Chronic kidney disease, stage 4 (severe): Secondary | ICD-10-CM | POA: Diagnosis not present

## 2022-01-02 DIAGNOSIS — Z79899 Other long term (current) drug therapy: Secondary | ICD-10-CM

## 2022-01-02 DIAGNOSIS — Z7952 Long term (current) use of systemic steroids: Secondary | ICD-10-CM | POA: Diagnosis not present

## 2022-01-02 DIAGNOSIS — J9601 Acute respiratory failure with hypoxia: Secondary | ICD-10-CM | POA: Diagnosis present

## 2022-01-02 DIAGNOSIS — E039 Hypothyroidism, unspecified: Secondary | ICD-10-CM | POA: Diagnosis not present

## 2022-01-02 DIAGNOSIS — N179 Acute kidney failure, unspecified: Secondary | ICD-10-CM | POA: Diagnosis present

## 2022-01-02 DIAGNOSIS — K219 Gastro-esophageal reflux disease without esophagitis: Secondary | ICD-10-CM | POA: Diagnosis not present

## 2022-01-02 DIAGNOSIS — M3130 Wegener's granulomatosis without renal involvement: Secondary | ICD-10-CM | POA: Diagnosis not present

## 2022-01-02 DIAGNOSIS — J121 Respiratory syncytial virus pneumonia: Secondary | ICD-10-CM | POA: Diagnosis not present

## 2022-01-02 DIAGNOSIS — Z7969 Long term (current) use of other immunomodulators and immunosuppressants: Secondary | ICD-10-CM | POA: Diagnosis not present

## 2022-01-02 DIAGNOSIS — D649 Anemia, unspecified: Secondary | ICD-10-CM | POA: Diagnosis present

## 2022-01-02 DIAGNOSIS — T86812 Lung transplant infection: Principal | ICD-10-CM | POA: Diagnosis present

## 2022-01-02 DIAGNOSIS — I10 Essential (primary) hypertension: Secondary | ICD-10-CM

## 2022-01-02 DIAGNOSIS — Z942 Lung transplant status: Secondary | ICD-10-CM

## 2022-01-02 DIAGNOSIS — Z79890 Hormone replacement therapy (postmenopausal): Secondary | ICD-10-CM

## 2022-01-02 DIAGNOSIS — E46 Unspecified protein-calorie malnutrition: Secondary | ICD-10-CM | POA: Diagnosis not present

## 2022-01-02 LAB — CBC WITH DIFFERENTIAL/PLATELET
Abs Immature Granulocytes: 0.02 10*3/uL (ref 0.00–0.07)
Basophils Absolute: 0 10*3/uL (ref 0.0–0.1)
Basophils Relative: 0 %
Eosinophils Absolute: 0 10*3/uL (ref 0.0–0.5)
Eosinophils Relative: 1 %
HCT: 34.2 % — ABNORMAL LOW (ref 36.0–46.0)
Hemoglobin: 10.1 g/dL — ABNORMAL LOW (ref 12.0–15.0)
Immature Granulocytes: 0 %
Lymphocytes Relative: 5 %
Lymphs Abs: 0.2 10*3/uL — ABNORMAL LOW (ref 0.7–4.0)
MCH: 29.4 pg (ref 26.0–34.0)
MCHC: 29.5 g/dL — ABNORMAL LOW (ref 30.0–36.0)
MCV: 99.7 fL (ref 80.0–100.0)
Monocytes Absolute: 0.2 10*3/uL (ref 0.1–1.0)
Monocytes Relative: 5 %
Neutro Abs: 4.3 10*3/uL (ref 1.7–7.7)
Neutrophils Relative %: 89 %
Platelets: 279 10*3/uL (ref 150–400)
RBC: 3.43 MIL/uL — ABNORMAL LOW (ref 3.87–5.11)
RDW: 15.4 % (ref 11.5–15.5)
WBC: 4.8 10*3/uL (ref 4.0–10.5)
nRBC: 0 % (ref 0.0–0.2)

## 2022-01-02 LAB — APTT: aPTT: 28 seconds (ref 24–36)

## 2022-01-02 LAB — URINALYSIS, ROUTINE W REFLEX MICROSCOPIC
Bilirubin Urine: NEGATIVE
Glucose, UA: NEGATIVE mg/dL
Hgb urine dipstick: NEGATIVE
Ketones, ur: NEGATIVE mg/dL
Leukocytes,Ua: NEGATIVE
Nitrite: NEGATIVE
Protein, ur: NEGATIVE mg/dL
Specific Gravity, Urine: 1.015 (ref 1.005–1.030)
pH: 5 (ref 5.0–8.0)

## 2022-01-02 LAB — RESPIRATORY PANEL BY PCR

## 2022-01-02 LAB — COMPREHENSIVE METABOLIC PANEL
ALT: 18 U/L (ref 0–44)
AST: 22 U/L (ref 15–41)
Albumin: 3.9 g/dL (ref 3.5–5.0)
Alkaline Phosphatase: 89 U/L (ref 38–126)
Anion gap: 7 (ref 5–15)
BUN: 50 mg/dL — ABNORMAL HIGH (ref 8–23)
CO2: 27 mmol/L (ref 22–32)
Calcium: 8.9 mg/dL (ref 8.9–10.3)
Chloride: 106 mmol/L (ref 98–111)
Creatinine, Ser: 2.16 mg/dL — ABNORMAL HIGH (ref 0.44–1.00)
GFR, Estimated: 24 mL/min — ABNORMAL LOW (ref >60)
Glucose, Bld: 95 mg/dL (ref 70–99)
Potassium: 4.8 mmol/L (ref 3.5–5.1)
Sodium: 140 mmol/L (ref 135–145)
Total Bilirubin: 1 mg/dL (ref 0.3–1.2)
Total Protein: 7.2 g/dL (ref 6.5–8.1)

## 2022-01-02 LAB — RESP PANEL BY RT-PCR (FLU A&B, COVID) ARPGX2
Influenza A by PCR: NEGATIVE
Influenza B by PCR: NEGATIVE
SARS Coronavirus 2 by RT PCR: NEGATIVE

## 2022-01-02 LAB — LACTIC ACID, PLASMA
Lactic Acid, Venous: 0.8 mmol/L (ref 0.5–1.9)
Lactic Acid, Venous: 0.8 mmol/L (ref 0.5–1.9)

## 2022-01-02 LAB — PROTIME-INR
INR: 1.1 (ref 0.8–1.2)
Prothrombin Time: 13.8 seconds (ref 11.4–15.2)

## 2022-01-02 MED ORDER — TACROLIMUS 1 MG PO CAPS
1.5000 mg | ORAL_CAPSULE | Freq: Every day | ORAL | Status: DC
  Administered 2022-01-03 – 2022-01-04 (×2): 1.5 mg via ORAL
  Filled 2022-01-02 (×2): qty 1

## 2022-01-02 MED ORDER — POLYVINYL ALCOHOL 1.4 % OP SOLN
1.0000 [drp] | Freq: Every day | OPHTHALMIC | Status: DC
  Administered 2022-01-03 – 2022-01-04 (×2): 1 [drp] via OPHTHALMIC
  Filled 2022-01-02: qty 15

## 2022-01-02 MED ORDER — PREDNISONE 5 MG PO TABS
5.0000 mg | ORAL_TABLET | Freq: Every day | ORAL | Status: DC
  Administered 2022-01-03 – 2022-01-04 (×2): 5 mg via ORAL
  Filled 2022-01-02 (×2): qty 1

## 2022-01-02 MED ORDER — SODIUM CHLORIDE 0.9 % IV SOLN
2.0000 g | INTRAVENOUS | Status: DC
  Administered 2022-01-03: 2 g via INTRAVENOUS
  Filled 2022-01-02: qty 12.5

## 2022-01-02 MED ORDER — EZETIMIBE 10 MG PO TABS
10.0000 mg | ORAL_TABLET | Freq: Every day | ORAL | Status: DC
  Administered 2022-01-03 – 2022-01-04 (×2): 10 mg via ORAL
  Filled 2022-01-02 (×2): qty 1

## 2022-01-02 MED ORDER — VITAMIN B-12 1000 MCG PO TABS
1000.0000 ug | ORAL_TABLET | Freq: Every day | ORAL | Status: DC
  Administered 2022-01-02 – 2022-01-04 (×3): 1000 ug via ORAL
  Filled 2022-01-02 (×3): qty 1

## 2022-01-02 MED ORDER — TACROLIMUS 1 MG PO CAPS
1.0000 mg | ORAL_CAPSULE | Freq: Every day | ORAL | Status: DC
  Administered 2022-01-02 – 2022-01-03 (×2): 1 mg via ORAL
  Filled 2022-01-02 (×2): qty 1

## 2022-01-02 MED ORDER — PANTOPRAZOLE SODIUM 40 MG PO TBEC
40.0000 mg | DELAYED_RELEASE_TABLET | Freq: Every day | ORAL | Status: DC
  Administered 2022-01-03 – 2022-01-04 (×2): 40 mg via ORAL
  Filled 2022-01-02 (×2): qty 1

## 2022-01-02 MED ORDER — LACTATED RINGERS IV BOLUS
500.0000 mL | Freq: Once | INTRAVENOUS | Status: AC
  Administered 2022-01-02: 500 mL via INTRAVENOUS

## 2022-01-02 MED ORDER — LEVOTHYROXINE SODIUM 75 MCG PO TABS
75.0000 ug | ORAL_TABLET | Freq: Every day | ORAL | Status: DC
  Administered 2022-01-03 – 2022-01-04 (×2): 75 ug via ORAL
  Filled 2022-01-02 (×2): qty 1

## 2022-01-02 MED ORDER — MYCOPHENOLATE MOFETIL 250 MG PO CAPS
750.0000 mg | ORAL_CAPSULE | Freq: Two times a day (BID) | ORAL | Status: DC
  Administered 2022-01-02 – 2022-01-04 (×4): 750 mg via ORAL
  Filled 2022-01-02 (×4): qty 3

## 2022-01-02 MED ORDER — OYSTER SHELL CALCIUM/D3 500-5 MG-MCG PO TABS
1.0000 | ORAL_TABLET | Freq: Every day | ORAL | Status: DC
  Administered 2022-01-03 – 2022-01-04 (×2): 1 via ORAL
  Filled 2022-01-02 (×2): qty 1

## 2022-01-02 MED ORDER — SODIUM CHLORIDE 0.9 % IV SOLN
2.0000 g | Freq: Once | INTRAVENOUS | Status: AC
  Administered 2022-01-02: 2 g via INTRAVENOUS
  Filled 2022-01-02: qty 12.5

## 2022-01-02 MED ORDER — HYDROCOD POLI-CHLORPHE POLI ER 10-8 MG/5ML PO SUER
5.0000 mL | Freq: Every evening | ORAL | Status: DC | PRN
  Administered 2022-01-03 (×2): 5 mL via ORAL
  Filled 2022-01-02 (×2): qty 5

## 2022-01-02 MED ORDER — ESCITALOPRAM OXALATE 20 MG PO TABS
20.0000 mg | ORAL_TABLET | Freq: Two times a day (BID) | ORAL | Status: DC
  Administered 2022-01-02 – 2022-01-04 (×4): 20 mg via ORAL
  Filled 2022-01-02 (×4): qty 1

## 2022-01-02 MED ORDER — TACROLIMUS 1 MG PO CAPS: 1.0000 mg | ORAL_CAPSULE | Freq: Two times a day (BID) | ORAL | Status: DC

## 2022-01-02 MED ORDER — VANCOMYCIN HCL IN DEXTROSE 1-5 GM/200ML-% IV SOLN
1000.0000 mg | Freq: Once | INTRAVENOUS | Status: AC
  Administered 2022-01-02: 1000 mg via INTRAVENOUS
  Filled 2022-01-02: qty 200

## 2022-01-02 MED ORDER — ACETAMINOPHEN 500 MG PO TABS
500.0000 mg | ORAL_TABLET | Freq: Four times a day (QID) | ORAL | Status: DC | PRN
  Administered 2022-01-04: 500 mg via ORAL
  Filled 2022-01-02: qty 1

## 2022-01-02 MED ORDER — TACROLIMUS 0.5 MG PO CAPS
0.5000 mg | ORAL_CAPSULE | Freq: Two times a day (BID) | ORAL | Status: DC
  Filled 2022-01-02: qty 1

## 2022-01-02 MED ORDER — AZITHROMYCIN 250 MG PO TABS
250.0000 mg | ORAL_TABLET | ORAL | Status: DC
  Administered 2022-01-03: 250 mg via ORAL
  Filled 2022-01-02: qty 1

## 2022-01-02 MED ORDER — VANCOMYCIN HCL 750 MG/150ML IV SOLN
750.0000 mg | INTRAVENOUS | Status: DC
  Filled 2022-01-02: qty 150

## 2022-01-02 MED ORDER — CARBOXYMETHYLCELLULOSE SODIUM 0.5 % OP SOLN: 1.0000 [drp] | Freq: Every day | OPHTHALMIC | Status: DC

## 2022-01-02 MED ORDER — CALCIUM CITRATE-VITAMIN D 250-5 MG-MCG PO TABS: 1.0000 | ORAL_TABLET | Freq: Every day | ORAL | Status: DC

## 2022-01-02 MED ORDER — AMLODIPINE BESYLATE 5 MG PO TABS
2.5000 mg | ORAL_TABLET | Freq: Every day | ORAL | Status: DC
  Administered 2022-01-03: 2.5 mg via ORAL
  Filled 2022-01-02: qty 1

## 2022-01-02 MED ORDER — METOPROLOL SUCCINATE ER 50 MG PO TB24
50.0000 mg | ORAL_TABLET | Freq: Every day | ORAL | Status: DC
  Administered 2022-01-03: 50 mg via ORAL
  Filled 2022-01-02: qty 1

## 2022-01-02 MED ORDER — VITAMIN D 25 MCG (1000 UNIT) PO TABS
1000.0000 [IU] | ORAL_TABLET | Freq: Every day | ORAL | Status: DC
  Administered 2022-01-03 – 2022-01-04 (×2): 1000 [IU] via ORAL
  Filled 2022-01-02 (×2): qty 1

## 2022-01-02 NOTE — Assessment & Plan Note (Signed)
-  AKI with creatinine of 2.16 from prior of 1.8. -Give 500 cc LR bolus and follow repeat in the morning

## 2022-01-02 NOTE — Progress Notes (Signed)
Call received from ED at [facility]   Patient is 71 year old female with history of Wegener's granulomatosis, s/p lung transplant at [facility] about 10 years ago, with chronic rejection, currently on immunosuppressants and steroids.  Presented to the ED today with complaint of cough, progressive shortness of breath RVP negative  X-ray chest with Multifocal pneumonia Cefepime and vancomycin 2 lpm O2  EDP talked to pulmonologist at [facility] They are at capacity right now and unable to accept.  Recommended to continue treatment as any other bacterial pneumonia and consider transfer if she deteriorates  Accepted to medical tele bed. Because of high patient census, there is a chance patient will not have inpatient bed evaluate for next several hours or even overnight.  I have asked the ED attending to resume her transplant meds so she wouldn't miss any.

## 2022-01-02 NOTE — Progress Notes (Signed)
Pharmacy Antibiotic Note  Brianna Norman is a 71 y.o. female admitted on 01/02/2022 with pneumonia. Pharmacy has been consulted for vancomycin and cefepime dosing. Pt is afebrile and WBC is WNL. Scr is elevated at 2.16 and lactic acid is WNL.   Plan: Vancomycin 1g IV x 1 then '750mg'$  IV Q48H Cefepime 2g IV Q24H  F/u renal fxn, C&S, clinical status and peak/trough at SS  Height: '5\' 6"'$  (167.6 cm) Weight: 46.3 kg (102 lb) IBW/kg (Calculated) : 59.3  Temp (24hrs), Avg:98 F (36.7 C), Min:97.7 F (36.5 C), Max:98.3 F (36.8 C)  Recent Labs  Lab 01/02/22 1211  WBC 4.8  CREATININE 2.16*  LATICACIDVEN 0.8    Estimated Creatinine Clearance: 17.5 mL/min (A) (by C-G formula based on SCr of 2.16 mg/dL (H)).    Allergies  Allergen Reactions   Nsaids     S/p lung transplant   Quinolones     Ruptured achiles tendon    Antimicrobials this admission: Vanc 12/5>> Cefepime 12/5>>  Dose adjustments this admission: N/A  Microbiology results: Pending  Thank you for allowing pharmacy to be a part of this patient's care.  Zamari Bonsall, Rande Lawman 01/02/2022 1:17 PM

## 2022-01-02 NOTE — ED Triage Notes (Signed)
Pt c/o cough, congestion and SOB x 1 week. Denies any fever. Pt is a lung transplant patient and feels like she is developing pneumonia.

## 2022-01-02 NOTE — Assessment & Plan Note (Signed)
Continue amlodipine, metoprolol

## 2022-01-02 NOTE — Assessment & Plan Note (Signed)
-   Wegener's granulomatosis s/p lung transplant in 2013 - Continue tacrolimus, CellCept and prophylactic azithromycin

## 2022-01-02 NOTE — ED Notes (Signed)
Report to [place], [place]

## 2022-01-02 NOTE — Assessment & Plan Note (Signed)
Continue levothyroxine 

## 2022-01-02 NOTE — Assessment & Plan Note (Signed)
-   Secondary to multifocal pneumonia in the setting of Wegener's granulomatosis s/p lung transplant on immunosuppressant -Presented with hypoxia down to 78% requiring 2 L via nasal cannula with chest x-ray findings of multifocal pneumonia - Goal O2 saturation of greater than 92% - Obtain sputum culture, urine Legionella/strep pneumo antigen

## 2022-01-02 NOTE — H&P (Addendum)
History and Physical    Patient: Brianna Norman DOB: Jan 20, 1951 DOA: 01/02/2022 DOS: the patient was seen and examined on 01/02/2022 PCP: Derrill Center., MD  Patient coming from:  [facility] ED  Chief Complaint:  Chief Complaint  Patient presents with   Cough   Shortness of Breath   HPI: Brianna Norman is a 71 y.o. female with medical history significant of Wegener's granulomatosis s/p lung transplant at [facility] 2013, chronic rejection on immunosuppressants/steroid and prophylaxis azithromycin,  HTN, hypothyroidism who presents with progressive shortness of breath and found to have multifocal pneumonia.  EDP spoke with  pulmonologist at [facility]. They were at capacity and unable to accept.  Recommended to continue treatment as any other bacterial pneumonia and consider transfer if she deteriorates .  Patient has some chronic dyspnea due to her lung transplant however this was acutely worse in the past 2 days with mild cough, runny nose.  No fever.  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   In the ED, she was hypoxic to 78% requiring 2LNC. No leukocytosis. Normal lactate.   CXR with bibasilar opacity and RML consolidation. Negative flu/COVID. RVP pending.   AKI with creatinine of 2.16 from prior of 1.8.    Review of Systems: As mentioned in the history of present illness. All other systems reviewed and are negative. Past Medical History:  Diagnosis Date   Rheumatoid arteritis ([place])    Shingles    Thyroid activity decreased    Wegner's disease (congenital syphilitic osteochondritis)    Past Surgical History:  Procedure Laterality Date   ABDOMINAL SURGERY     COLONOSCOPY WITH PROPOFOL N/A 12/10/2014   Procedure: COLONOSCOPY WITH PROPOFOL;  Surgeon: Carol Ada, MD;  Location: [facility] ENDOSCOPY;  Service: Endoscopy;  Laterality: N/A;   COLONOSCOPY WITH PROPOFOL N/A 12/30/2020   Procedure: COLONOSCOPY WITH PROPOFOL;  Surgeon: Carol Ada, MD;  Location: [facility] ENDOSCOPY;  Service: Endoscopy;  Laterality: N/A;   lunmg transplant Bilateral 2013   POLYPECTOMY  12/30/2020   Procedure: POLYPECTOMY;  Surgeon: Carol Ada, MD;  Location: [facility] ENDOSCOPY;  Service: Endoscopy;;   Social History:  reports that she has never smoked. She has never used smokeless tobacco. She reports that she does not drink alcohol and does not use drugs.  Allergies  Allergen Reactions   Nsaids     S/p lung transplant   Quinolones     Ruptured achiles tendon    History reviewed. No pertinent family history.  Prior to Admission medications   Medication Sig Start Date End Date Taking? Authorizing Provider  acetaminophen (TYLENOL) 500 MG tablet Take 500 mg by mouth every 6 (six) hours as needed for mild pain.   Yes [provider]  amLODipine (NORVASC) 2.5 MG tablet Take 2.5 mg by mouth at bedtime.   Yes [provider]  azithromycin (ZITHROMAX) 250 MG tablet Take 250 mg by mouth every Monday, Wednesday, and Friday.   Yes [provider]  baclofen (LIORESAL) 10 MG tablet Take 1 tablet (10 mg total) by mouth at bedtime as needed for muscle spasms. 03/28/21  Yes Gladys Damme, MD  Calcium Citrate-Vitamin D 250-5 MG-MCG TABS Take 1 tablet by mouth daily.   Yes [provider]  carboxymethylcellulose (REFRESH PLUS) 0.5 % SOLN 1 drop daily.   Yes [provider]  Cholecalciferol 25 MCG (1000 UT) tablet Take 1,000 Units by mouth daily.   Yes [provider]  diazepam (DIASTAT PEDIATRIC) 2.5  MG GEL Place 2.5 mg rectally daily as needed (rectal spasm). Patient not taking: Reported on 01/02/2022 03/29/21   Charlesetta Shanks, MD  diltiazem 2 % GEL Apply 1 application topically 2 (two) times daily. Patient not taking: Reported on 01/02/2022 03/29/21   Charlesetta Shanks, MD  escitalopram (LEXAPRO) 20 MG tablet Take 20 mg by mouth in the morning and at bedtime.   Yes [provider]  ezetimibe (ZETIA) 10 MG  tablet Take 10 mg by mouth daily.   Yes [provider]  furosemide (LASIX) 40 MG tablet Take 40 mg by mouth every other day.   Yes [provider]  levothyroxine (SYNTHROID) 125 MCG tablet Take 75 mcg by mouth daily before breakfast.   Yes [provider]  metoprolol succinate (TOPROL-XL) 50 MG 24 hr tablet Take 50 mg by mouth at bedtime. Take with or immediately following a meal.   Yes [provider]  mycophenolate (CELLCEPT) 250 MG capsule Take 750 mg by mouth 2 (two) times daily.   Yes [provider]  pantoprazole (PROTONIX) 40 MG tablet Take 40 mg by mouth daily.   Yes [provider]  predniSONE (DELTASONE) 5 MG tablet Take 5 mg by mouth daily with breakfast.   Yes [provider]  tacrolimus (PROGRAF) 0.5 MG capsule Take 0.5 mg by mouth 2 (two) times daily.   Yes [provider]  tacrolimus (PROGRAF) 1 MG capsule Take 1 mg by mouth 2 (two) times daily.   Yes [provider]  vitamin B-12 (CYANOCOBALAMIN) 1000 MCG tablet Take 1,000 mcg by mouth daily.   Yes [provider]  cetirizine (ZYRTEC) 10 MG tablet Take 10 mg by mouth daily as needed for allergies. Patient not taking: Reported on 01/02/2022    [provider]  diltiazem 2 % GEL Apply 1 application topically 2 (two) times daily. Patient not taking: Reported on 01/02/2022 03/28/21   Gladys Damme, MD  ferrous sulfate 325 (65 FE) MG tablet Take 325 mg by mouth daily with breakfast. Patient not taking: Reported on 01/02/2022    [provider]  Multiple Vitamin (MULTIVITAMIN) tablet Take 1 tablet by mouth daily. Patient not taking: Reported on 01/02/2022    [provider]  Omega-3 Fatty Acids (FISH OIL PO) Take 1 capsule by mouth daily. Patient not taking: Reported on 01/02/2022    [provider]  sodium chloride (OCEAN) 0.65 % SOLN nasal spray Place 1 spray into both nostrils in the morning and at  bedtime. Patient not taking: Reported on 01/02/2022    [provider]    Physical Exam: Vitals:   01/02/22 1330 01/02/22 1445 01/02/22 1600 01/02/22 1820  BP: 122/65 119/70 124/68 115/65  Pulse: (!) 53 (!) 54 (!) 55 (!) 54  Resp: (!) 27 (!) 21 (!) 31 20  Temp:    98.9 F (37.2 C)  TempSrc:    Oral  SpO2: 100% 100% 100% 100%  Weight:      Height:       Constitutional: NAD, calm, comfortable and nontoxic elderly female sitting upright in bed eating dinner Eyes: lids and conjunctivae normal ENMT: Mucous membranes are moist Neck: normal, supple Respiratory: Basilar crackles with diffuse rhonchi throughout. Normal respiratory effort. No accessory muscle use.  On 2 L via nasal cannula Cardiovascular: Regular rate and rhythm, no murmurs / rubs / gallops. No extremity edema.   Abdomen: no tenderness,  Bowel sounds positive.  Musculoskeletal: no clubbing / cyanosis. No joint deformity upper and  lower extremities. Normal muscle tone.  Skin: no rashes, lesions, ulcers. Neurologic: CN 2-12 grossly intact.Strength 5/5 in all 4.  Psychiatric: Normal judgment and insight. Alert and oriented x 3. Normal mood. Data Reviewed:  See HPI  Assessment and Plan: * Acute hypoxic respiratory failure ([place]) - Secondary to multifocal pneumonia in the setting of Wegener's granulomatosis s/p lung transplant on immunosuppressant -Presented with hypoxia down to 78% requiring 2 L via nasal cannula with chest x-ray findings of multifocal pneumonia - Goal O2 saturation of greater than 92% - Obtain sputum culture, urine Legionella/strep pneumo antigen  Hypothyroidism Continue levothyroxine  HTN (hypertension) Continue amlodipine, metoprolol  Chronic rejection of allograft lung (HCC) - Wegener's granulomatosis s/p lung transplant in 2013 - Continue tacrolimus, CellCept and prophylactic azithromycin  AKI (acute kidney injury) ([place]) -AKI with creatinine of 2.16 from prior of 1.8. -Give 500 cc LR  bolus and follow repeat in the morning      Advance Care Planning: Full  Consults: None  Family Communication: Discussed with husband at bedside  Severity of Illness: The appropriate patient status for this patient is OBSERVATION. Observation status is judged to be reasonable and necessary in order to provide the required intensity of service to ensure the patient's safety. The patient's presenting symptoms, physical exam findings, and initial radiographic and laboratory data in the context of their medical condition is felt to place them at decreased risk for further clinical deterioration. Furthermore, it is anticipated that the patient will be medically stable for discharge from the hospital within 2 midnights of admission.   Author: Orene Desanctis, DO 01/02/2022 9:16 PM  For on call review [email or web address].

## 2022-01-02 NOTE — ED Provider Notes (Signed)
[facility] EMERGENCY DEPARTMENT Provider Note   CSN: [id number] Arrival date & time: 01/02/22  1148     History  Chief Complaint  Patient presents with   Cough   Shortness of Breath    Brianna Norman is a 71 y.o. female.  Patient here with shortness of breath, cough, sputum production.  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  History of Wegener's.  Patient had symptoms of cough for the last few weeks family member as well but her symptoms of gotten worse the last day or 2 with increased shortness of breath and cough.  Denies any chest pain.  Maybe some swelling in her legs.  Denies any weakness, numbness, recent fever.  The history is provided by the patient.       Home Medications Prior to Admission medications   Medication Sig Start Date End Date Taking? Authorizing Provider  diazepam (DIASTAT PEDIATRIC) 2.5 MG GEL Place 2.5 mg rectally daily as needed (rectal spasm). 03/29/21   Charlesetta Shanks, MD  diltiazem 2 % GEL Apply 1 application topically 2 (two) times daily. 03/29/21   Charlesetta Shanks, MD  acetaminophen (TYLENOL) 500 MG tablet Take 500 mg by mouth every 6 (six) hours as needed for mild pain.    [provider]  azithromycin (ZITHROMAX) 250 MG tablet Take 250 mg by mouth every Monday, Wednesday, and Friday.    [provider]  baclofen (LIORESAL) 10 MG tablet Take 1 tablet (10 mg total) by mouth at bedtime as needed for muscle spasms. 03/28/21   Gladys Damme, MD  Calcium Citrate-Vitamin D 250-5 MG-MCG TABS Take 1 tablet by mouth daily.    [provider]  carboxymethylcellulose (REFRESH PLUS) 0.5 % SOLN 1 drop daily.    [provider]  cetirizine (ZYRTEC) 10 MG tablet Take 10 mg by mouth daily as needed for allergies.    [provider]  Cholecalciferol 25 MCG (1000 UT) tablet Take 1,000 Units by mouth daily.    [provider]  diltiazem 2 % GEL Apply 1 application topically 2 (two) times daily. 03/28/21   Gladys Damme, MD  escitalopram (LEXAPRO) 20 MG tablet Take 20 mg by mouth daily.    [provider]  ezetimibe (ZETIA) 10 MG tablet Take 10 mg by mouth daily.    [provider]  ferrous sulfate 325 (65 FE) MG tablet Take 325 mg by mouth daily with breakfast.    [provider]  levothyroxine (SYNTHROID) 125 MCG tablet Take 125 mcg by mouth daily before breakfast.    [provider]  Multiple Vitamin (MULTIVITAMIN) tablet Take 1 tablet by mouth daily.    [provider]  mycophenolate (CELLCEPT) 500 MG tablet Take 500 mg by mouth 2 (two) times daily.    [provider]  Omega-3 Fatty Acids (FISH OIL PO) Take 1 capsule by mouth daily.    [provider]  pantoprazole (PROTONIX) 40 MG tablet Take 40 mg by mouth daily.    [provider]  predniSONE (DELTASONE) 5 MG tablet Take 5 mg by mouth daily with breakfast.    [provider]  sodium chloride (OCEAN) 0.65 % SOLN nasal spray Place 1 spray into both nostrils in the morning and at bedtime.    [provider]  tacrolimus (PROGRAF) 0.5 MG capsule Take 0.5 mg by mouth 2 (two) times daily.    [provider]  tacrolimus (  PROGRAF) 1 MG capsule Take 1 mg by mouth 2 (two) times daily.    [provider]  vitamin B-12 (CYANOCOBALAMIN) 1000 MCG tablet Take 1,000 mcg by mouth daily.    [provider]      Allergies    Nsaids and Quinolones    Review of Systems   Review of Systems  Physical Exam Updated Vital Signs BP 122/65   Pulse (!) 53   Temp 98.3 F (36.8 C) (Oral)   Resp (!) 27   Ht '5\' 6"'$  (1.676 m)   Wt 46.3 kg   SpO2 100%   BMI 16.46 kg/m  Physical Exam Vitals and nursing note reviewed.  Constitutional:      General: She is not in acute distress.    Appearance: She is well-developed. She is not ill-appearing.  HENT:      Head: Normocephalic and atraumatic.     Mouth/Throat:     Mouth: Mucous membranes are moist.  Eyes:     Conjunctiva/sclera: Conjunctivae normal.     Pupils: Pupils are equal, round, and reactive to light.  Cardiovascular:     Rate and Rhythm: Normal rate and regular rhythm.     Pulses: Normal pulses.     Heart sounds: Normal heart sounds. No murmur heard. Pulmonary:     Effort: Pulmonary effort is normal. No respiratory distress.     Breath sounds: Decreased breath sounds present.  Abdominal:     Palpations: Abdomen is soft.     Tenderness: There is no abdominal tenderness.  Musculoskeletal:        General: No swelling.     Cervical back: Normal range of motion and neck supple.  Skin:    General: Skin is warm and dry.     Capillary Refill: Capillary refill takes less than 2 seconds.  Neurological:     Mental Status: She is alert.  Psychiatric:        Mood and Affect: Mood normal.     ED Results / Procedures / Treatments   Labs (all labs ordered are listed, but only abnormal results are displayed) Labs Reviewed  COMPREHENSIVE METABOLIC PANEL - Abnormal; Notable for the following components:      Result Value   BUN 50 (*)    Creatinine, Ser 2.16 (*)    GFR, Estimated 24 (*)    All other components within normal limits  CBC WITH DIFFERENTIAL/PLATELET - Abnormal; Notable for the following components:   RBC 3.43 (*)    Hemoglobin 10.1 (*)    HCT 34.2 (*)    MCHC 29.5 (*)    Lymphs Abs 0.2 (*)    All other components within normal limits  RESP PANEL BY RT-PCR (FLU A&B, COVID) ARPGX2  CULTURE, BLOOD (ROUTINE X 2)  CULTURE, BLOOD (ROUTINE X 2)  URINE CULTURE  LACTIC ACID, PLASMA  LACTIC ACID, PLASMA  PROTIME-INR  APTT  URINALYSIS, ROUTINE W REFLEX MICROSCOPIC    EKG EKG Interpretation  Date/Time:  Tuesday January 02 2022 12:06:14 EST Ventricular Rate:  55 PR Interval:  136 QRS Duration: 136 QT Interval:  502 QTC Calculation: 480 R Axis:   76 Text  Interpretation: Sinus bradycardia Right bundle branch block Abnormal ECG No previous ECGs available Confirmed by Lennice Sites ([id number]) on 01/02/2022 12:45:28 PM  Radiology DG Chest 2 View  Result Date: 01/02/2022 CLINICAL DATA:  Cough and congestion. Shortness of breathe for 1 week. EXAM: CHEST - 2 VIEW COMPARISON:  09/08/2020 FINDINGS: Postoperative changes are identified from  bilateral lung transplant. Bilateral hazy lung opacities are noted involving the right lower lung, right midlung and left base. There is blunting of the costophrenic angles concerning for small bilateral pleural effusions. Right middle lobe atelectasis versus consolidation is suspected. Unchanged appearance of biapical pleuroparenchymal scarring. IMPRESSION: 1. Bilateral hazy lung opacities compatible with multifocal pneumonia. 2. Suspect small bilateral pleural effusions. 3. Suspect right middle lobe atelectasis versus consolidation. Electronically Signed   By: Kerby Moors M.D.   On: 01/02/2022 12:50    Procedures Procedures    Medications Ordered in ED Medications  vancomycin (VANCOCIN) IVPB 1000 mg/200 mL premix (1,000 mg Intravenous New Bag/Given 01/02/22 1356)  vancomycin (VANCOREADY) IVPB 750 mg/150 mL (has no administration in time range)  ceFEPIme (MAXIPIME) 2 g in sodium chloride 0.9 % 100 mL IVPB (has no administration in time range)  ceFEPIme (MAXIPIME) 2 g in sodium chloride 0.9 % 100 mL IVPB (2 g Intravenous New Bag/Given 01/02/22 1354)    ED Course/ Medical Decision Making/ A&P                           Medical Decision Making Amount and/or Complexity of Data Reviewed Labs: ordered. Radiology: ordered.  Risk Prescription drug management. Decision regarding hospitalization.   Brianna Norman is here with cough and shortness of breath.  History of lung transplant on immunosuppressants, Wegener's.  Patient hypoxic upon arrival with oxygen in the 70s.  Improved on 2 L of oxygen.  Diminished breath  sounds throughout.  Has had viral symptoms the last few weeks and now worsening cough and shortness of breath.  Differential diagnosis is suspect viral process versus pneumonia versus could be transplant issue.  Patient has normal vitals.  No fever.  She does have hypoxia now improved on 2 L of oxygen.  Infectious workup initiated with CBC, CMP, lactic acid, viral panel, chest x-ray.  Per my review interpretation of x-ray consistent with multifocal pneumonia.  Radiology report with the same.  Given that she is on immunosuppressants with new hypoxia will start IV antibiotics with vancomycin and cefepime.  She does not appear to have a significant lactic acid or leukocytosis.  COVID and flu test are negative.  I will talk with [facility] about possibly admission there but suspect there will be some capacity issues.  May need to have patient admitted to the [facility].  At this time this seems to be mostly infectious process.  Have low suspicion for transplant issue.  Talked with [facility] who will put the patient on a wait list but at this time cannot guarantee that she will get a bed in the next day or 2.  Patient therefore admitted to medicine team at [facility] for further treatment.  This chart was dictated using voice recognition software.  Despite best efforts to proofread,  errors can occur which can change the documentation meaning.         Final Clinical Impression(s) / ED Diagnoses Final diagnoses:  Acute respiratory failure with hypoxia [facility])  Multifocal pneumonia    Rx / DC Orders ED Discharge Orders     None         Lennice Sites, DO 01/02/22 1433

## 2022-01-03 DIAGNOSIS — Z681 Body mass index (BMI) 19 or less, adult: Secondary | ICD-10-CM | POA: Diagnosis not present

## 2022-01-03 DIAGNOSIS — B9789 Other viral agents as the cause of diseases classified elsewhere: Secondary | ICD-10-CM | POA: Diagnosis present

## 2022-01-03 DIAGNOSIS — N184 Chronic kidney disease, stage 4 (severe): Secondary | ICD-10-CM | POA: Diagnosis present

## 2022-01-03 DIAGNOSIS — Z79890 Hormone replacement therapy (postmenopausal): Secondary | ICD-10-CM | POA: Diagnosis not present

## 2022-01-03 DIAGNOSIS — Z79899 Other long term (current) drug therapy: Secondary | ICD-10-CM | POA: Diagnosis not present

## 2022-01-03 DIAGNOSIS — M3130 Wegener's granulomatosis without renal involvement: Secondary | ICD-10-CM | POA: Diagnosis present

## 2022-01-03 DIAGNOSIS — Z7952 Long term (current) use of systemic steroids: Secondary | ICD-10-CM | POA: Diagnosis not present

## 2022-01-03 DIAGNOSIS — E46 Unspecified protein-calorie malnutrition: Secondary | ICD-10-CM | POA: Diagnosis present

## 2022-01-03 DIAGNOSIS — B348 Other viral infections of unspecified site: Secondary | ICD-10-CM

## 2022-01-03 DIAGNOSIS — J9601 Acute respiratory failure with hypoxia: Secondary | ICD-10-CM | POA: Diagnosis present

## 2022-01-03 DIAGNOSIS — E039 Hypothyroidism, unspecified: Secondary | ICD-10-CM | POA: Diagnosis present

## 2022-01-03 DIAGNOSIS — T86812 Lung transplant infection: Secondary | ICD-10-CM | POA: Diagnosis present

## 2022-01-03 DIAGNOSIS — Z7969 Long term (current) use of other immunomodulators and immunosuppressants: Secondary | ICD-10-CM | POA: Diagnosis not present

## 2022-01-03 DIAGNOSIS — J121 Respiratory syncytial virus pneumonia: Secondary | ICD-10-CM | POA: Diagnosis present

## 2022-01-03 DIAGNOSIS — I129 Hypertensive chronic kidney disease with stage 1 through stage 4 chronic kidney disease, or unspecified chronic kidney disease: Secondary | ICD-10-CM | POA: Diagnosis present

## 2022-01-03 DIAGNOSIS — Z20822 Contact with and (suspected) exposure to covid-19: Secondary | ICD-10-CM | POA: Diagnosis present

## 2022-01-03 DIAGNOSIS — G4733 Obstructive sleep apnea (adult) (pediatric): Secondary | ICD-10-CM | POA: Diagnosis present

## 2022-01-03 DIAGNOSIS — N179 Acute kidney failure, unspecified: Secondary | ICD-10-CM | POA: Diagnosis present

## 2022-01-03 DIAGNOSIS — D649 Anemia, unspecified: Secondary | ICD-10-CM | POA: Diagnosis present

## 2022-01-03 DIAGNOSIS — K219 Gastro-esophageal reflux disease without esophagitis: Secondary | ICD-10-CM | POA: Diagnosis present

## 2022-01-03 LAB — CBC WITH DIFFERENTIAL/PLATELET
Abs Immature Granulocytes: 0.03 10*3/uL (ref 0.00–0.07)
Basophils Absolute: 0 10*3/uL (ref 0.0–0.1)
Basophils Relative: 0 %
Eosinophils Absolute: 0 10*3/uL (ref 0.0–0.5)
Eosinophils Relative: 1 %
HCT: 32.7 % — ABNORMAL LOW (ref 36.0–46.0)
Hemoglobin: 9.2 g/dL — ABNORMAL LOW (ref 12.0–15.0)
Immature Granulocytes: 1 %
Lymphocytes Relative: 6 %
Lymphs Abs: 0.3 10*3/uL — ABNORMAL LOW (ref 0.7–4.0)
MCH: 29.5 pg (ref 26.0–34.0)
MCHC: 28.1 g/dL — ABNORMAL LOW (ref 30.0–36.0)
MCV: 104.8 fL — ABNORMAL HIGH (ref 80.0–100.0)
Monocytes Absolute: 0.3 10*3/uL (ref 0.1–1.0)
Monocytes Relative: 6 %
Neutro Abs: 3.8 10*3/uL (ref 1.7–7.7)
Neutrophils Relative %: 86 %
Platelets: 228 10*3/uL (ref 150–400)
RBC: 3.12 MIL/uL — ABNORMAL LOW (ref 3.87–5.11)
RDW: 14.9 % (ref 11.5–15.5)
WBC: 4.4 10*3/uL (ref 4.0–10.5)
nRBC: 0 % (ref 0.0–0.2)

## 2022-01-03 LAB — BASIC METABOLIC PANEL
Anion gap: 7 (ref 5–15)
BUN: 50 mg/dL — ABNORMAL HIGH (ref 8–23)
CO2: 27 mmol/L (ref 22–32)
Calcium: 8.7 mg/dL — ABNORMAL LOW (ref 8.9–10.3)
Chloride: 107 mmol/L (ref 98–111)
Creatinine, Ser: 2.02 mg/dL — ABNORMAL HIGH (ref 0.44–1.00)
GFR, Estimated: 26 mL/min — ABNORMAL LOW (ref >60)
Glucose, Bld: 106 mg/dL — ABNORMAL HIGH (ref 70–99)
Potassium: 5.7 mmol/L — ABNORMAL HIGH (ref 3.5–5.1)
Sodium: 141 mmol/L (ref 135–145)

## 2022-01-03 LAB — PROCALCITONIN: Procalcitonin: 0.1 ng/mL

## 2022-01-03 LAB — URINE CULTURE: Culture: NO GROWTH

## 2022-01-03 LAB — MRSA NEXT GEN BY PCR, NASAL: MRSA by PCR Next Gen: NOT DETECTED

## 2022-01-03 LAB — STREP PNEUMONIAE URINARY ANTIGEN: Strep Pneumo Urinary Antigen: NEGATIVE

## 2022-01-03 MED ORDER — HEPARIN SODIUM (PORCINE) 5000 UNIT/ML IJ SOLN
5000.0000 [IU] | Freq: Three times a day (TID) | INTRAMUSCULAR | Status: DC
  Administered 2022-01-03 – 2022-01-04 (×3): 5000 [IU] via SUBCUTANEOUS
  Filled 2022-01-03 (×3): qty 1

## 2022-01-03 NOTE — Progress Notes (Signed)
Mobility Specialist - Progress Note   01/03/22 1445  Mobility  Activity Ambulated independently in hallway  Level of Assistance Independent  Distance Ambulated (ft) 600 ft  Activity Response Tolerated well  Mobility Referral Yes  $Mobility charge 1 Mobility   Pt received in bed and agreeable to mobility. Pt stated she had some SOB, but felt fine. No other complaints during session. Pt to EOB after session with all needs met & husband in room.   [facility]

## 2022-01-03 NOTE — Progress Notes (Signed)
Mobility Specialist - Progress Note   01/03/22 1018  Oxygen Therapy  O2 Device Nasal Cannula  O2 Flow Rate (L/min) 2 L/min  Mobility  Activity Ambulated independently in hallway  Level of Assistance Independent  Assistive Device None  Distance Ambulated (ft) 600 ft  Activity Response Tolerated well  Mobility Referral Yes  $Mobility charge 1 Mobility   Pt received in bed and agreeable to mobility. Pt mentioned walking slow due to achilles injury back in 2007. No other complaints during session. Pt to EOB after session with all needs met.    [facility] Mobility Specialist  

## 2022-01-03 NOTE — Progress Notes (Signed)
Returned call to [place], Health visitor at [facility]. pt has recd a bed 7830. Report to be called to nurse [phone]. also for the life flight [phone]. they request a hard copy of the chart and to push the power share information. Upon hearing this news, pt states she is not sure she wants to transfer. She will speak with her husband and discuss. Await her decision.

## 2022-01-03 NOTE — Plan of Care (Signed)

## 2022-01-03 NOTE — Progress Notes (Signed)
Called report to [place], Therapist, sports at [facility]. informed her that pt would like to speak with the accepting physician and gave the pt number to have him/her give the pt a call. Also informed Dr Horris Latino of this.

## 2022-01-03 NOTE — Progress Notes (Signed)
PROGRESS NOTE  [name] CXK:[id number] DOB: 1950-05-09 DOA: 01/02/2022 PCP: Derrill Center., MD  HPI/Recap of past 24 hours: Brianna Norman is a 71 y.o. 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  Husband had similar symptoms. Pt found to have multifocal pneumonia possibly due to rhinovirus and RSV. EDP spoke with pulmonologist at [facility]. They were at capacity and unable to accept.  Recommended to continue treatment and consider transfer if she deteriorates. 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 In the ED, she was hypoxic to 78% requiring 2L [place]. CXR with bibasilar opacity and RML consolidation. Negative flu/COVID.  Patient admitted for further management.    Had an extensive discussion with patient, reports improvements, still chronically short of breath, but denies it is worsening, denies any chest pain, fever/chills.   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 ([place])   S/P lung transplant ([place])   Chronic rejection of allograft lung (HCC)   HTN (hypertension)   Hypothyroidism   RSV and rhinovirus infection Multifocal pneumonia Acute hypoxic respiratory failure-hypoxic on presentation to 78% Currently afebrile Currently on 2 L of O2, saturating well In the setting of granulomatosis with polyangiitis s/p lung transplant on immunosuppressant Respiratory viral panel positive for RSV and rhinovirus Chest x-ray findings of multifocal pneumonia Urine strep pneumonia negative, urine Legionella pending Sputum culture pending BC x 2 pending, UCx pending Procalcitonin pending to  evaluate viral versus bacterial PNA Continue vancomycin (pending MRSA PCR), cefepime for now Supportive care Supplemental O2 Monitor closely  Possible AKI on CKD stage 4 Baseline creatinine around 2 (Care Everywhere) S/p IV fluids Follow daily BMP  History of right kidney hydronephrosis Possible reason for AKI on CKD Planning for cystourethroscopy with stent insertion of right kidney in [facility] on 01/16/2022  Normocytic anemia Possible from chronic kidney disease Daily CBC  Hypothyroidism Continue levothyroxine   HTN (hypertension) Continue amlodipine, metoprolol  GERD Continue PPI  Chronic rejection of allograft lung (HCC) History of granulomatosis with polyangiitis s/p lung transplant in 2013 Continue tacrolimus, CellCept and prophylactic azithromycin   Malnutrition Estimated body mass index is 16.46 kg/m as calculated from the following:   Height as of this encounter: '5\' 6"'$  (1.676 m).   Weight as of this encounter: 46.3 kg.   OSA Recent sleep study showed OSA, yet to obtain CPAP with settings      Code Status: Discussed with patients DNR  Family Communication: None at bedside  Disposition Plan: Status is: Observation The patient will require care spanning > 2 midnights and should be moved to inpatient because: Level of care      Consultants: None  Procedures: None  Antimicrobials: Cefepime Vancomycin Azithromycin  DVT prophylaxis: Heparin [place]   Objective: Vitals:   01/02/22 1820 01/02/22 2120 01/03/22 0204 01/03/22 0621  BP: 115/65 122/68 116/64 117/61  Pulse: (!) 54 (!) 58 (!) 59 (!) 59  Resp: '20 17 17 17  '$ Temp: 98.9 F (37.2 C) 97.9 F (36.6 C) 98.1 F (36.7 C)   TempSrc: Oral     SpO2: 100% 97% 91% 92%  Weight:      Height:        Intake/Output Summary (Last 24 hours) at 01/03/2022  [place] filed at 01/03/2022 0300 Gross per 24 hour  Intake 436.7 ml  Output --  Net 436.7 ml   Filed Weights   01/02/22 1201  Weight:  46.3 kg    Exam: General: NAD, thin Cardiovascular: S1, S2 present Respiratory: CTAB Abdomen: Soft, nontender, nondistended, bowel sounds present Musculoskeletal: No bilateral pedal edema noted Skin: Normal Psychiatry: Normal mood     Data Reviewed: CBC: Recent Labs  Lab 01/02/22 1211  WBC 4.8  NEUTROABS 4.3  HGB 10.1*  HCT 34.2*  MCV 99.7  PLT 885   Basic Metabolic Panel: Recent Labs  Lab 01/02/22 1211  NA 140  K 4.8  CL 106  CO2 27  GLUCOSE 95  BUN 50*  CREATININE 2.16*  CALCIUM 8.9   GFR: Estimated Creatinine Clearance: 17.5 mL/min (A) (by C-G formula based on SCr of 2.16 mg/dL (H)). Liver Function Tests: Recent Labs  Lab 01/02/22 1211  AST 22  ALT 18  ALKPHOS 89  BILITOT 1.0  PROT 7.2  ALBUMIN 3.9   No results for input(s): "LIPASE", "AMYLASE" in the last 168 hours. No results for input(s): "AMMONIA" in the last 168 hours. Coagulation Profile: Recent Labs  Lab 01/02/22 1211  INR 1.1   Cardiac Enzymes: No results for input(s): "CKTOTAL", "CKMB", "CKMBINDEX", "TROPONINI" in the last 168 hours. BNP (last 3 results) No results for input(s): "PROBNP" in the last 8760 hours. HbA1C: No results for input(s): "HGBA1C" in the last 72 hours. CBG: No results for input(s): "GLUCAP" in the last 168 hours. Lipid Profile: No results for input(s): "CHOL", "HDL", "LDLCALC", "TRIG", "CHOLHDL", "LDLDIRECT" in the last 72 hours. Thyroid Function Tests: No results for input(s): "TSH", "T4TOTAL", "FREET4", "T3FREE", "THYROIDAB" in the last 72 hours. Anemia Panel: No results for input(s): "VITAMINB12", "FOLATE", "FERRITIN", "TIBC", "IRON", "RETICCTPCT" in the last 72 hours. Urine analysis:    Component Value Date/Time   COLORURINE YELLOW 01/02/2022 1213   APPEARANCEUR CLEAR 01/02/2022 1213   LABSPEC 1.015 01/02/2022 1213   PHURINE 5.0 01/02/2022 1213   GLUCOSEU NEGATIVE 01/02/2022 1213   HGBUR NEGATIVE 01/02/2022 [place] 01/02/2022  1213   KETONESUR NEGATIVE 01/02/2022 1213   PROTEINUR NEGATIVE 01/02/2022 1213   NITRITE NEGATIVE 01/02/2022 1213   LEUKOCYTESUR NEGATIVE 01/02/2022 1213   Sepsis Labs: '@LABRCNTIP'$ (procalcitonin:4,lacticidven:4)  ) Recent Results (from the past 240 hour(s))  Resp Panel by RT-PCR (Flu A&B, Covid) Anterior Nasal Swab     Status: None   Collection Time: 01/02/22 12:11 PM   Specimen: Anterior Nasal Swab  Result Value Ref Range Status   SARS Coronavirus 2 by RT PCR NEGATIVE NEGATIVE Final    Comment: (NOTE) SARS-CoV-2 target nucleic acids are NOT DETECTED.  The SARS-CoV-2 RNA is generally detectable in upper respiratory specimens during the acute phase of infection. The lowest concentration of SARS-CoV-2 viral copies this assay can detect is 138 copies/mL. A negative result does not preclude SARS-Cov-2 infection and should not be used as the sole basis for treatment or other patient management decisions. A negative result may occur with  improper specimen collection/handling, submission of specimen other than nasopharyngeal swab, presence of viral mutation(s) within the areas targeted by this assay, and inadequate number of viral copies(<138 copies/mL). A negative result must be combined with clinical observations, patient history, and epidemiological information. The expected result is Negative.  Fact Sheet for Patients:  EntrepreneurPulse.com.au  Fact Sheet for Healthcare Providers:  IncredibleEmployment.be  This test is no t yet approved or cleared by the  [place] Architectural technologist and  has been authorized for detection and/or diagnosis of SARS-CoV-2 by FDA under an Print production planner (EUA). This EUA will remain  in effect (meaning this test can be used) for the duration of the COVID-19 declaration under Section 564(b)(1) of the Act, 21 U.S.C.section 360bbb-3(b)(1), unless the authorization is terminated  or revoked sooner.        Influenza A by PCR NEGATIVE NEGATIVE Final   Influenza B by PCR NEGATIVE NEGATIVE Final    Comment: (NOTE) The Xpert Xpress SARS-CoV-2/FLU/RSV plus assay is intended as an aid in the diagnosis of influenza from Nasopharyngeal swab specimens and should not be used as a sole basis for treatment. Nasal washings and aspirates are unacceptable for Xpert Xpress SARS-CoV-2/FLU/RSV testing.  Fact Sheet for Patients: EntrepreneurPulse.com.au  Fact Sheet for Healthcare Providers: IncredibleEmployment.be  This test is not yet approved or cleared by the [place] FDA and has been authorized for detection and/or diagnosis of SARS-CoV-2 by FDA under an Emergency Use Authorization (EUA). This EUA will remain in effect (meaning this test can be used) for the duration of the COVID-19 declaration under Section 564(b)(1) of the Act, 21 U.S.C. section 360bbb-3(b)(1), unless the authorization is terminated or revoked.  Performed at [facility], [address] [place], [place] [address]   Blood Culture (routine x 2)     Status: None (Preliminary result)   Collection Time: 01/02/22 12:31 PM   Specimen: BLOOD  Result Value Ref Range Status   Specimen Description   Final    BLOOD RIGHT ANTECUBITAL Performed at [facility], [address] [place], [place] [address]    Special Requests   Final    BOTTLES DRAWN AEROBIC AND ANAEROBIC Blood Culture results may not be optimal due to an excessive volume of blood received in culture bottles Performed at [facility], [address] [place], [place] [address]    Culture   Final    NO GROWTH < 24 HOURS Performed at [facility], [place] [address] [place], [place] [address]    Report Status PENDING  Incomplete  Blood Culture (routine x 2)     Status: None (Preliminary result)   Collection Time: 01/02/22  1:45 PM   Specimen: BLOOD  Result Value Ref Range Status   Specimen  Description   Final    BLOOD LEFT ANTECUBITAL Performed at [facility], [address] [place], [place] [address]    Special Requests   Final    BOTTLES DRAWN AEROBIC AND ANAEROBIC Blood Culture adequate volume Performed at [facility], [address] [place], [place] [address]    Culture   Final    NO GROWTH < 24 HOURS Performed at [facility], [place] [address] [place], [place] [address]    Report Status PENDING  Incomplete  Respiratory (~20 pathogens) panel by PCR     Status: Abnormal   Collection Time: 01/02/22  3:46 PM   Specimen: Nasopharyngeal Swab; Respiratory  Result Value Ref Range Status   Adenovirus NOT DETECTED NOT DETECTED Final   Coronavirus 229E NOT DETECTED NOT DETECTED Final    Comment: (NOTE) The Coronavirus on the Respiratory Panel, DOES NOT test for the novel  Coronavirus (2019 nCoV)    Coronavirus HKU1 NOT DETECTED NOT DETECTED Final   Coronavirus NL63 NOT DETECTED NOT DETECTED Final   Coronavirus OC43 NOT DETECTED NOT DETECTED Final  Metapneumovirus NOT DETECTED NOT DETECTED Final   Rhinovirus / Enterovirus DETECTED (A) NOT DETECTED Final   Influenza A NOT DETECTED NOT DETECTED Final   Influenza B NOT DETECTED NOT DETECTED Final   Parainfluenza Virus 1 NOT DETECTED NOT DETECTED Final   Parainfluenza Virus 2 NOT DETECTED NOT DETECTED Final   Parainfluenza Virus 3 NOT DETECTED NOT DETECTED Final   Parainfluenza Virus 4 NOT DETECTED NOT DETECTED Final   Respiratory Syncytial Virus DETECTED (A) NOT DETECTED Final   Bordetella pertussis NOT DETECTED NOT DETECTED Final   Bordetella Parapertussis NOT DETECTED NOT DETECTED Final   Chlamydophila pneumoniae NOT DETECTED NOT DETECTED Final   Mycoplasma pneumoniae NOT DETECTED NOT DETECTED Final    Comment: Performed at [facility], [place] [address] [place], [place] [address]      Studies: No results found.  Scheduled Meds:  amLODipine  2.5 mg Oral QHS   azithromycin   250 mg Oral Q M,W,F   calcium-vitamin D  1 tablet Oral Q breakfast   cholecalciferol  1,000 Units Oral Daily   cyanocobalamin  1,000 mcg Oral Daily   escitalopram  20 mg Oral BID   ezetimibe  10 mg Oral Daily   levothyroxine  75 mcg Oral QAC breakfast   metoprolol succinate  50 mg Oral QHS   mycophenolate  750 mg Oral BID   pantoprazole  40 mg Oral Daily   polyvinyl alcohol  1 drop Both Eyes Daily   predniSONE  5 mg Oral Q breakfast   tacrolimus  1.5 mg Oral Daily   And   tacrolimus  1 mg Oral QHS    Continuous Infusions:  ceFEPime (MAXIPIME) IV     [START ON 01/04/2022] vancomycin       LOS: 0 days     Alma Friendly, MD [facility]  If 7PM-7AM, please contact night-coverage [email or web address] 01/03/2022, 2:55 PM

## 2022-01-03 NOTE — Progress Notes (Signed)
  Transition of Care [facility]) Screening Note   Patient Details  Name: Brianna Norman Date of Birth: 19-Nov-1950   Transition of Care [facility]) CM/SW Contact:    Vassie Moselle, LCSW Phone Number: 01/03/2022, 11:53 AM    Transition of Care Department [facility]) has reviewed patient and no TOC needs have been identified at this time. We will continue to monitor patient advancement through interdisciplinary progression rounds. If new patient transition needs arise, please place a TOC consult.

## 2022-01-04 DIAGNOSIS — J121 Respiratory syncytial virus pneumonia: Secondary | ICD-10-CM | POA: Diagnosis not present

## 2022-01-04 DIAGNOSIS — J9601 Acute respiratory failure with hypoxia: Secondary | ICD-10-CM | POA: Diagnosis not present

## 2022-01-04 LAB — BASIC METABOLIC PANEL
Anion gap: 10 (ref 5–15)
BUN: 50 mg/dL — ABNORMAL HIGH (ref 8–23)
CO2: 24 mmol/L (ref 22–32)
Calcium: 8.5 mg/dL — ABNORMAL LOW (ref 8.9–10.3)
Chloride: 108 mmol/L (ref 98–111)
Creatinine, Ser: 1.98 mg/dL — ABNORMAL HIGH (ref 0.44–1.00)
GFR, Estimated: 27 mL/min — ABNORMAL LOW (ref >60)
Glucose, Bld: 86 mg/dL (ref 70–99)
Potassium: 5.5 mmol/L — ABNORMAL HIGH (ref 3.5–5.1)
Sodium: 142 mmol/L (ref 135–145)

## 2022-01-04 LAB — CBC WITH DIFFERENTIAL/PLATELET
Abs Immature Granulocytes: 0.02 10*3/uL (ref 0.00–0.07)
Basophils Absolute: 0 10*3/uL (ref 0.0–0.1)
Basophils Relative: 0 %
Eosinophils Absolute: 0.1 10*3/uL (ref 0.0–0.5)
Eosinophils Relative: 2 %
HCT: 31.4 % — ABNORMAL LOW (ref 36.0–46.0)
Hemoglobin: 8.8 g/dL — ABNORMAL LOW (ref 12.0–15.0)
Immature Granulocytes: 1 %
Lymphocytes Relative: 16 %
Lymphs Abs: 0.5 10*3/uL — ABNORMAL LOW (ref 0.7–4.0)
MCH: 29.6 pg (ref 26.0–34.0)
MCHC: 28 g/dL — ABNORMAL LOW (ref 30.0–36.0)
MCV: 105.7 fL — ABNORMAL HIGH (ref 80.0–100.0)
Monocytes Absolute: 0.4 10*3/uL (ref 0.1–1.0)
Monocytes Relative: 13 %
Neutro Abs: 2 10*3/uL (ref 1.7–7.7)
Neutrophils Relative %: 68 %
Platelets: 223 10*3/uL (ref 150–400)
RBC: 2.97 MIL/uL — ABNORMAL LOW (ref 3.87–5.11)
RDW: 14.8 % (ref 11.5–15.5)
WBC: 2.9 10*3/uL — ABNORMAL LOW (ref 4.0–10.5)
nRBC: 0 % (ref 0.0–0.2)

## 2022-01-04 LAB — LEGIONELLA PNEUMOPHILA SEROGP 1 UR AG: L. pneumophila Serogp 1 Ur Ag: NEGATIVE

## 2022-01-04 LAB — PROCALCITONIN: Procalcitonin: 0.1 ng/mL

## 2022-01-04 NOTE — Progress Notes (Signed)
Report called to [name] [phone] Patient to go to bed 7807. All questions answered at this time.

## 2022-01-04 NOTE — Progress Notes (Signed)
Per dayshift RN, pt was supposed to transfer to [place] but pt requested to speak to [place] MD before transferring. Although, pt has no transfer orders nor discharge summary in chart. Zebedee Iba, NP, notified of situation and instructed that it would be best if pt stayed tonight until transfer orders can be completed in AM. [facility] Charge RN, Anda Kraft, as well as [employer] notified of situation. Pt made aware of situation and agreed to stay tonight. Pt asked for primary RN to note that pt does want to transfer to [facility] once bed is available. Zebedee Iba, NP notified and made aware. Plan of care ongoing.

## 2022-01-04 NOTE — Progress Notes (Signed)
[facility] setup to take patient to [facility] room [phone].

## 2022-01-04 NOTE — Discharge Summary (Signed)
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  Husband had similar symptoms. Pt found to have multifocal pneumonia possibly due to rhinovirus and RSV. EDP spoke with pulmonologist at [facility]. They were at capacity and unable to accept.  Recommended to continue treatment and consider transfer if she deteriorates. 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 In the ED, she was hypoxic to 78% requiring 2L [place]. CXR with bibasilar opacity and RML consolidation. Negative flu/COVID.  Patient admitted for further management.    Patient denies any new complaints.    Assessment and Plan:  RSV and rhinovirus infection Multifocal pneumonia Acute hypoxic respiratory failure-hypoxic on presentation to 78% Currently afebrile Currently on 2 L of O2, saturating well In the setting of granulomatosis with polyangiitis s/p lung transplant on immunosuppressant Respiratory viral panel positive for RSV and rhinovirus Chest x-ray findings of multifocal pneumonia Urine strep pneumonia negative, urine Legionella pending Sputum culture  pending BC x 2 NGTD, UCx no growth Procalcitonin 0.1 D/c vancomycin (MRSA PCR neg) on 12/7, received 2 days of cefepime Supportive care Supplemental O2 Transfer to [facility]   Possible AKI on CKD stage 4 Baseline creatinine around 2 (Care Everywhere) S/p IV fluids   History of right kidney hydronephrosis Planning for cystourethroscopy with stent insertion of right kidney in [facility] on 01/16/2022   Normocytic anemia Possible from chronic kidney disease   Hypothyroidism Continue levothyroxine   HTN (hypertension) Continue amlodipine, metoprolol   GERD Continue PPI   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   Weight as of this encounter: 46.3 kg.    OSA Recent sleep study showed OSA, yet to obtain CPAP with settings          Consultants: None Procedures performed: None Disposition:  Transfer to [facility] Diet recommendation:  Cardiac diet   DISCHARGE MEDICATION: Allergies as of 01/04/2022       Reactions   Nsaids    S/p lung transplant   Quinolones    Ruptured achiles tendon        Medication List     STOP taking these medications    cetirizine 10 MG tablet Commonly known as: ZYRTEC   diazepam 2.5 MG Gel Commonly known as: Diastat Pediatric   diltiazem 2 % Gel   FISH OIL PO   multivitamin tablet   sodium chloride 0.65 % Soln nasal spray Commonly known as: OCEAN       TAKE these  medications    acetaminophen 500 MG tablet Commonly known as: TYLENOL Take 500 mg by mouth every 6 (six) hours as needed for mild pain.   amLODipine 2.5 MG tablet Commonly known as: NORVASC Take 2.5 mg by mouth at bedtime.   azithromycin 250 MG tablet Commonly known as: ZITHROMAX Take 250 mg by  mouth every Monday, Wednesday, and Friday.   baclofen 10 MG tablet Commonly known as: LIORESAL Take 1 tablet (10 mg total) by mouth at bedtime as needed for muscle spasms.   Calcium Citrate-Vitamin D 250-5 MG-MCG Tabs Take 1 tablet by mouth daily.   carboxymethylcellulose 0.5 % Soln Commonly known as: REFRESH PLUS 1 drop daily.   Cholecalciferol 25 MCG (1000 UT) tablet Take 1,000 Units by mouth daily.   cyanocobalamin 1000 MCG tablet Commonly known as: VITAMIN B12 Take 1,000 mcg by mouth daily.   escitalopram 20 MG tablet Commonly known as: LEXAPRO Take 20 mg by mouth in the morning and at bedtime.   ezetimibe 10 MG tablet Commonly known as: ZETIA Take 10 mg by mouth daily.   ferrous sulfate 325 (65 FE) MG tablet Take 325 mg by mouth daily with breakfast.   furosemide 40 MG tablet Commonly known as: LASIX Take 40 mg by mouth every other day.   levothyroxine 125 MCG tablet Commonly known as: SYNTHROID Take 75 mcg by mouth daily before breakfast.   metoprolol succinate 50 MG 24 hr tablet Commonly known as: TOPROL-XL Take 50 mg by mouth at bedtime. Take with or immediately following a meal.   mycophenolate 250 MG capsule Commonly known as: CELLCEPT Take 750 mg by mouth 2 (two) times daily.   pantoprazole 40 MG tablet Commonly known as: PROTONIX Take 40 mg by mouth daily.   predniSONE 5 MG tablet Commonly known as: DELTASONE Take 5 mg by mouth daily with breakfast.   tacrolimus 0.5 MG capsule Commonly known as: PROGRAF Take 0.5 mg by mouth 2 (two) times daily.   tacrolimus 1 MG capsule Commonly known as: PROGRAF Take 1 mg by mouth 2 (two) times daily.        Discharge Exam: Filed Weights   01/02/22 1201  Weight: 46.3 kg   General: NAD  Cardiovascular: S1, S2 present Respiratory: CTAB Abdomen: Soft, nontender, nondistended, bowel sounds present Musculoskeletal: No bilateral pedal edema noted Skin: Normal Psychiatry: Normal mood   Condition at  discharge: stable  The results of significant diagnostics from this hospitalization (including imaging, microbiology, ancillary and laboratory) are listed below for reference.   Imaging Studies: DG Chest 2 View  Result Date: 01/02/2022 CLINICAL DATA:  Cough and congestion. Shortness of breathe for 1 week. EXAM: CHEST - 2 VIEW COMPARISON:  09/08/2020 FINDINGS: Postoperative changes are identified from bilateral lung transplant. Bilateral hazy lung opacities are noted involving the right lower lung, right midlung and left base. There is blunting of the costophrenic angles concerning for small bilateral pleural effusions. Right middle lobe atelectasis versus consolidation is suspected. Unchanged appearance of biapical pleuroparenchymal scarring. IMPRESSION: 1. Bilateral hazy lung opacities compatible with multifocal pneumonia. 2. Suspect small bilateral pleural effusions. 3. Suspect right middle lobe atelectasis versus consolidation. Electronically Signed   By: Kerby Moors M.D.   On: 01/02/2022 12:50    Microbiology: Results for orders placed or performed during the hospital encounter of 01/02/22  Resp Panel by RT-PCR (Flu A&B, Covid) Anterior Nasal Swab     Status: None   Collection Time: 01/02/22 12:11 PM   Specimen: Anterior Nasal Swab  Result Value Ref Range Status   SARS Coronavirus 2 by RT PCR NEGATIVE NEGATIVE Final    Comment: (NOTE) SARS-CoV-2 target nucleic acids are NOT DETECTED.  The SARS-CoV-2 RNA is generally detectable in upper respiratory specimens during the acute phase of infection. The lowest concentration of SARS-CoV-2 viral copies this assay can detect is 138 copies/mL. A negative result does not preclude SARS-Cov-2 infection and should not be used as the sole basis for treatment or other patient management decisions. A negative result may occur with  improper specimen collection/handling, submission of specimen other than nasopharyngeal swab, presence of viral  mutation(s) within the areas targeted by this assay, and inadequate number of viral copies(<138 copies/mL). A negative result must be combined with clinical observations, patient history, and epidemiological information. The expected result is Negative.  Fact Sheet for Patients:  EntrepreneurPulse.com.au  Fact Sheet for Healthcare Providers:  IncredibleEmployment.be  This test is no t yet approved or cleared by the [place] FDA and  has been authorized for detection and/or diagnosis of SARS-CoV-2 by FDA under an Emergency Use Authorization (EUA). This EUA will remain  in effect (meaning this test can be used) for the duration of the COVID-19 declaration under Section 564(b)(1) of the Act, 21 U.S.C.section 360bbb-3(b)(1), unless the authorization is terminated  or revoked sooner.       Influenza A by PCR NEGATIVE NEGATIVE Final   Influenza B by PCR NEGATIVE NEGATIVE Final    Comment: (NOTE) The Xpert Xpress SARS-CoV-2/FLU/RSV plus assay is intended as an aid in the diagnosis of influenza from Nasopharyngeal swab specimens and should not be used as a sole basis for treatment. Nasal washings and aspirates are unacceptable for Xpert Xpress SARS-CoV-2/FLU/RSV testing.  Fact Sheet for Patients: EntrepreneurPulse.com.au  Fact Sheet for Healthcare Providers: IncredibleEmployment.be  This test is not yet approved or cleared by the [place] FDA and has been authorized for detection and/or diagnosis of SARS-CoV-2 by FDA under an Emergency Use Authorization (EUA). This EUA will remain in effect (meaning this test can be used) for the duration of the COVID-19 declaration under Section 564(b)(1) of the Act, 21 U.S.C. section 360bbb-3(b)(1), unless the authorization is terminated or revoked.  Performed at [facility], [address] [place], [place] [address]   Blood Culture (routine x 2)      Status: None (Preliminary result)   Collection Time: 01/02/22 12:31 PM   Specimen: BLOOD  Result Value Ref Range Status   Specimen Description   Final    BLOOD RIGHT ANTECUBITAL Performed at [facility], [address] [place], [place] [address]    Special Requests   Final    BOTTLES DRAWN AEROBIC AND ANAEROBIC Blood Culture results may not be optimal due to an excessive volume of blood received in culture bottles Performed at [facility], [address] [place], [place] [address]    Culture   Final    NO GROWTH 2 DAYS Performed at [facility], [place] [address] [place], [place] [address]    Report Status PENDING  Incomplete  Blood Culture (routine x 2)     Status: None (Preliminary result)   Collection Time: 01/02/22  1:45 PM   Specimen: BLOOD  Result Value Ref Range Status   Specimen Description   Final    BLOOD LEFT ANTECUBITAL Performed at [facility], [address], [place], [place] [address]    Special Requests   Final  BOTTLES DRAWN AEROBIC AND ANAEROBIC Blood Culture adequate volume Performed at [facility], [address] [place], [place] [address]    Culture   Final    NO GROWTH 2 DAYS Performed at [facility], [place] [address], [place], [place] [address]    Report Status PENDING  Incomplete  Urine Culture     Status: None   Collection Time: 01/02/22  3:40 PM   Specimen: In/Out Cath Urine  Result Value Ref Range Status   Specimen Description   Final    IN/OUT CATH URINE Performed at [facility], [address] [place], [place] [address]    Special Requests   Final    NONE Performed at [facility], [address] [place], [place] [address]    Culture   Final    NO GROWTH Performed at [facility], [place] [address], [place], [place] [address]    Report Status 01/03/2022 FINAL  Final  Respiratory (~20 pathogens) panel by PCR     Status: Abnormal   Collection Time:  01/02/22  3:46 PM   Specimen: Nasopharyngeal Swab; Respiratory  Result Value Ref Range Status   Adenovirus NOT DETECTED NOT DETECTED Final   Coronavirus 229E NOT DETECTED NOT DETECTED Final    Comment: (NOTE) The Coronavirus on the Respiratory Panel, DOES NOT test for the novel  Coronavirus (2019 nCoV)    Coronavirus HKU1 NOT DETECTED NOT DETECTED Final   Coronavirus NL63 NOT DETECTED NOT DETECTED Final   Coronavirus OC43 NOT DETECTED NOT DETECTED Final   Metapneumovirus NOT DETECTED NOT DETECTED Final   Rhinovirus / Enterovirus DETECTED (A) NOT DETECTED Final   Influenza A NOT DETECTED NOT DETECTED Final   Influenza B NOT DETECTED NOT DETECTED Final   Parainfluenza Virus 1 NOT DETECTED NOT DETECTED Final   Parainfluenza Virus 2 NOT DETECTED NOT DETECTED Final   Parainfluenza Virus 3 NOT DETECTED NOT DETECTED Final   Parainfluenza Virus 4 NOT DETECTED NOT DETECTED Final   Respiratory Syncytial Virus DETECTED (A) NOT DETECTED Final   Bordetella pertussis NOT DETECTED NOT DETECTED Final   Bordetella Parapertussis NOT DETECTED NOT DETECTED Final   Chlamydophila pneumoniae NOT DETECTED NOT DETECTED Final   Mycoplasma pneumoniae NOT DETECTED NOT DETECTED Final    Comment: Performed at [facility], [place] [address] [place], [place] [address]  MRSA Next Gen by PCR, Nasal     Status: None   Collection Time: 01/03/22  3:27 PM   Specimen: Nasal Mucosa; Nasal Swab  Result Value Ref Range Status   MRSA by PCR Next Gen NOT DETECTED NOT DETECTED Final    Comment: (NOTE) The GeneXpert MRSA Assay (FDA approved for NASAL specimens only), is one component of a comprehensive MRSA colonization surveillance program. It is not intended to diagnose MRSA infection nor to guide or monitor treatment for MRSA infections. Test performance is not FDA approved in patients less than 49 years old. Performed at [facility], [place] [address], [place], [place] [address]      Labs: CBC: Recent Labs  Lab 01/02/22 1211 01/03/22 1619 01/04/22 0606  WBC 4.8 4.4 2.9*  NEUTROABS 4.3 3.8 2.0  HGB 10.1* 9.2* 8.8*  HCT 34.2* 32.7* 31.4*  MCV 99.7 104.8* 105.7*  PLT 279 228 831   Basic Metabolic Panel: Recent Labs  Lab 01/02/22 1211 01/03/22 1619 01/04/22 0606  NA 140 141 142  K 4.8 5.7* 5.5*  CL 106 107 108  CO2 '27 27 24  '$ GLUCOSE 95 106* 86  BUN 50* 50* 50*  CREATININE 2.16* 2.02* 1.98*  CALCIUM 8.9 8.7* 8.5*   Liver Function Tests: Recent Labs  Lab 01/02/22 1211  AST 22  ALT 18  ALKPHOS 89  BILITOT 1.0  PROT 7.2  ALBUMIN 3.9   CBG: No results for input(s): "GLUCAP" in the last 168 hours.  Discharge time spent: greater than 30 minutes.  Signed: Alma Friendly, MD [facility] 01/04/2022

## 2022-01-07 LAB — CULTURE, BLOOD (ROUTINE X 2)
Culture: NO GROWTH
Culture: NO GROWTH
Special Requests: ADEQUATE

## 2022-01-29 DEATH — deceased
